# Patient Record
Sex: Female | Born: 1935 | Race: White | Hispanic: No | Smoking: Never smoker
Health system: Southern US, Community
[De-identification: ages and names within clinical notes are randomized; demographics above are authoritative.]

## PROBLEM LIST (undated history)

## (undated) DIAGNOSIS — I10 Essential (primary) hypertension: Secondary | ICD-10-CM

## (undated) DIAGNOSIS — C801 Malignant (primary) neoplasm, unspecified: Secondary | ICD-10-CM

## (undated) DIAGNOSIS — F419 Anxiety disorder, unspecified: Secondary | ICD-10-CM

---

## 2011-07-19 ENCOUNTER — Telehealth: Payer: Self-pay

## 2011-07-19 ENCOUNTER — Ambulatory Visit (INDEPENDENT_AMBULATORY_CARE_PROVIDER_SITE_OTHER): Payer: Medicare Other | Admitting: Family Medicine

## 2011-07-19 VITALS — BP 170/82 | HR 101 | Temp 98.0°F | Resp 16 | Ht 59.5 in | Wt 99.8 lb

## 2011-07-19 DIAGNOSIS — R300 Dysuria: Secondary | ICD-10-CM

## 2011-07-19 DIAGNOSIS — N390 Urinary tract infection, site not specified: Secondary | ICD-10-CM

## 2011-07-19 DIAGNOSIS — I10 Essential (primary) hypertension: Secondary | ICD-10-CM

## 2011-07-19 LAB — POCT UA - MICROSCOPIC ONLY
Casts, Ur, LPF, POC: NEGATIVE
Yeast, UA: NEGATIVE

## 2011-07-19 LAB — POCT URINALYSIS DIPSTICK
Glucose, UA: NEGATIVE
Nitrite, UA: NEGATIVE
Urobilinogen, UA: 0.2

## 2011-07-19 MED ORDER — CIPROFLOXACIN HCL 250 MG PO TABS: 250.0000 mg | ORAL_TABLET | Freq: Two times a day (BID) | ORAL | Status: AC

## 2011-07-19 NOTE — Progress Notes (Signed)
  Subjective:    Patient ID: Julie [name], female    DOB: [birth year], 78 y.o.   MRN: [id number]  Urinary Tract Infection  This is a new problem. The current episode started yesterday. The problem occurs every urination. The problem has been unchanged. The pain is at a severity of 3/10. The pain is mild. There has been no fever. She is not sexually active. There is no history of pyelonephritis. Associated symptoms include hesitancy and urgency. Pertinent negatives include no chills, flank pain or hematuria. She has tried nothing for the symptoms.      Review of Systems  Constitutional: Negative for chills.  Genitourinary: Positive for hesitancy and urgency. Negative for hematuria and flank pain.       Objective:   Physical Exam  Constitutional: She appears well-developed and well-nourished.  Cardiovascular: Normal rate, regular rhythm and normal heart sounds.   Pulmonary/Chest: Effort normal and breath sounds normal.  Abdominal: Soft. There is Tenderness: suprapubic.Marland Kitchen  Neurological: She is alert.  Skin: Skin is warm.     Results for orders placed in visit on 07/19/11  POCT UA - MICROSCOPIC ONLY      Component Value Range   WBC, Ur, HPF, POC TNTC     RBC, urine, microscopic 6-10     Bacteria, U Microscopic 1+     Mucus, UA NEG     Epithelial cells, urine per micros 1-3     Crystals, Ur, HPF, POC NEG     Casts, Ur, LPF, POC NEG     Yeast, UA NEG    POCT URINALYSIS DIPSTICK      Component Value Range   Color, UA YELLOW     Clarity, UA SL CLOUDY     Glucose, UA NEG     Bilirubin, UA NEG     Ketones, UA NEG     Spec Grav, UA 1.010     Blood, UA MOD     pH, UA 6.0     Protein, UA 30     Urobilinogen, UA 0.2     Nitrite, UA NEG     Leukocytes, UA large (3+)    URINE CULTURE      Component Value Range   Preliminary Report Culture reincubated for better growth          Assessment & Plan:   1. Cystitis  POCT UA - Microscopic Only, POCT urinalysis dipstick,  ciprofloxacin (CIPRO) 250 MG tablet, Urine culture  2. HTN (hypertension), white coat component Continue current medications

## 2011-07-19 NOTE — Telephone Encounter (Signed)
Pharmacy is calling stating that the patient was seen by dr Hal Hope today and that she should have medications called in but they are not there   [phone] pharmacy

## 2011-07-20 NOTE — Telephone Encounter (Signed)
Spoke with pharmacist, had spoke with Dr Hal Hope last night and had medicine cleared up, had no furthur questions.

## 2011-07-22 LAB — URINE CULTURE: Colony Count: 70000

## 2011-07-30 ENCOUNTER — Telehealth: Payer: Self-pay

## 2011-07-30 NOTE — Telephone Encounter (Signed)
.  [facility] PT STATES SHE HAVE ANOTHER YEAST INFECTION AND WOULD LIKE TO HAVE SOME CIPRO CALLED IN. PLEASE CALL PT AT [phone]    [facility] ON [address]

## 2011-07-31 NOTE — Telephone Encounter (Signed)
PT CALLING BACK - ALMOST 24 HOURS HAS PASSED AND HAS NOT BEEN ACKNOWLEDGED  PLEASE CALL WITH ANSWER REGARDING SOME INFORMATION

## 2011-08-01 ENCOUNTER — Encounter: Payer: Self-pay | Admitting: Radiology

## 2011-08-01 NOTE — Telephone Encounter (Signed)
Left message on machine for patient to call back.  Patient was seen 3/7 for UTI and given Cipro by Dr Hal Hope.  We need to find out patient's symptoms and then route call to PA pool.

## 2011-08-01 NOTE — Telephone Encounter (Signed)
Pt is calling back for the third time for an answer for her cipro.  [facility] at [address] Please call [phone]

## 2011-08-01 NOTE — Telephone Encounter (Signed)
Pt CB and stated that she does not have a yeast infection. She had a UTI and Dr Hal Hope put her on 5 days of Cipro, which seemed to clear up infection, but she only had 2 -3 good days and then Sxs started to come back. Not as bad as it was but feels a "heaviness" and some pain with urination. She stated that when she gets UTI, she usually takes a longer round of Cipro, and she thinks maybe the 5 days just wasn't long enough to clear it up completely. Can we send in another round to the [facility] ([place])?

## 2011-08-02 MED ORDER — CIPROFLOXACIN HCL 500 MG PO TABS: 500.0000 mg | ORAL_TABLET | Freq: Two times a day (BID) | ORAL | Status: AC

## 2011-08-02 NOTE — Telephone Encounter (Signed)
I sent in new rx cipro. If no improvement RTC.  This is only a 5 day RX.

## 2011-08-02 NOTE — Telephone Encounter (Signed)
Called pt back and told her Rx has been sent into her phamacy

## 2011-08-02 NOTE — Telephone Encounter (Signed)
Pt called back in and wondered status of new Abx. Apologized to pt that message did not route correctly to provider and that I would personally ask a provider to address this and call her back. Please review message.

## 2012-02-26 ENCOUNTER — Other Ambulatory Visit: Payer: Self-pay | Admitting: Family Medicine

## 2017-02-05 ENCOUNTER — Ambulatory Visit
Admission: RE | Admit: 2017-02-05 | Discharge: 2017-02-05 | Disposition: A | Discharge status: Home / Self Care | Payer: Self-pay | Source: Ambulatory Visit | Attending: Family Medicine | Admitting: Family Medicine

## 2017-02-05 ENCOUNTER — Other Ambulatory Visit: Payer: Self-pay | Admitting: Family Medicine

## 2017-02-05 DIAGNOSIS — E871 Hypo-osmolality and hyponatremia: Secondary | ICD-10-CM

## 2017-02-05 DIAGNOSIS — K5900 Constipation, unspecified: Secondary | ICD-10-CM

## 2018-01-22 ENCOUNTER — Other Ambulatory Visit: Payer: Self-pay

## 2018-01-22 ENCOUNTER — Emergency Department (HOSPITAL_COMMUNITY): Payer: Medicare Other

## 2018-01-22 ENCOUNTER — Inpatient Hospital Stay (HOSPITAL_COMMUNITY)
Admission: EM | Admit: 2018-01-22 | Discharge: 2018-01-28 | DRG: 643 | Disposition: A | Discharge status: Skilled Nursing Facility | Payer: Medicare Other | Attending: Internal Medicine | Admitting: Internal Medicine

## 2018-01-22 ENCOUNTER — Encounter (HOSPITAL_COMMUNITY): Payer: Self-pay | Admitting: Emergency Medicine

## 2018-01-22 DIAGNOSIS — I10 Essential (primary) hypertension: Secondary | ICD-10-CM | POA: Diagnosis present

## 2018-01-22 DIAGNOSIS — Z79899 Other long term (current) drug therapy: Secondary | ICD-10-CM

## 2018-01-22 DIAGNOSIS — Z7189 Other specified counseling: Secondary | ICD-10-CM | POA: Diagnosis not present

## 2018-01-22 DIAGNOSIS — F05 Delirium due to known physiological condition: Secondary | ICD-10-CM | POA: Diagnosis present

## 2018-01-22 DIAGNOSIS — Z881 Allergy status to other antibiotic agents status: Secondary | ICD-10-CM

## 2018-01-22 DIAGNOSIS — G9349 Other encephalopathy: Secondary | ICD-10-CM | POA: Diagnosis present

## 2018-01-22 DIAGNOSIS — Z681 Body mass index (BMI) 19 or less, adult: Secondary | ICD-10-CM | POA: Diagnosis not present

## 2018-01-22 DIAGNOSIS — R918 Other nonspecific abnormal finding of lung field: Secondary | ICD-10-CM | POA: Diagnosis present

## 2018-01-22 DIAGNOSIS — D6489 Other specified anemias: Secondary | ICD-10-CM | POA: Diagnosis present

## 2018-01-22 DIAGNOSIS — E876 Hypokalemia: Secondary | ICD-10-CM | POA: Diagnosis present

## 2018-01-22 DIAGNOSIS — Z23 Encounter for immunization: Secondary | ICD-10-CM

## 2018-01-22 DIAGNOSIS — E871 Hypo-osmolality and hyponatremia: Secondary | ICD-10-CM | POA: Diagnosis present

## 2018-01-22 DIAGNOSIS — I4891 Unspecified atrial fibrillation: Secondary | ICD-10-CM | POA: Diagnosis present

## 2018-01-22 DIAGNOSIS — I5030 Unspecified diastolic (congestive) heart failure: Secondary | ICD-10-CM | POA: Diagnosis not present

## 2018-01-22 DIAGNOSIS — E43 Unspecified severe protein-calorie malnutrition: Secondary | ICD-10-CM | POA: Diagnosis present

## 2018-01-22 DIAGNOSIS — R339 Retention of urine, unspecified: Secondary | ICD-10-CM | POA: Diagnosis present

## 2018-01-22 DIAGNOSIS — F411 Generalized anxiety disorder: Secondary | ICD-10-CM | POA: Diagnosis present

## 2018-01-22 DIAGNOSIS — T502X5A Adverse effect of carbonic-anhydrase inhibitors, benzothiadiazides and other diuretics, initial encounter: Secondary | ICD-10-CM | POA: Diagnosis present

## 2018-01-22 DIAGNOSIS — F419 Anxiety disorder, unspecified: Secondary | ICD-10-CM | POA: Diagnosis present

## 2018-01-22 DIAGNOSIS — N6320 Unspecified lump in the left breast, unspecified quadrant: Secondary | ICD-10-CM | POA: Diagnosis present

## 2018-01-22 DIAGNOSIS — R531 Weakness: Secondary | ICD-10-CM | POA: Diagnosis not present

## 2018-01-22 DIAGNOSIS — E222 Syndrome of inappropriate secretion of antidiuretic hormone: Secondary | ICD-10-CM | POA: Diagnosis present

## 2018-01-22 DIAGNOSIS — M79605 Pain in left leg: Secondary | ICD-10-CM | POA: Diagnosis present

## 2018-01-22 DIAGNOSIS — M79609 Pain in unspecified limb: Secondary | ICD-10-CM | POA: Diagnosis not present

## 2018-01-22 DIAGNOSIS — R4189 Other symptoms and signs involving cognitive functions and awareness: Secondary | ICD-10-CM | POA: Diagnosis not present

## 2018-01-22 DIAGNOSIS — G9340 Encephalopathy, unspecified: Secondary | ICD-10-CM

## 2018-01-22 DIAGNOSIS — Z66 Do not resuscitate: Secondary | ICD-10-CM | POA: Diagnosis present

## 2018-01-22 DIAGNOSIS — R630 Anorexia: Secondary | ICD-10-CM | POA: Diagnosis present

## 2018-01-22 DIAGNOSIS — R634 Abnormal weight loss: Secondary | ICD-10-CM | POA: Diagnosis present

## 2018-01-22 HISTORY — DX: Anxiety disorder, unspecified: F41.9

## 2018-01-22 HISTORY — DX: Essential (primary) hypertension: I10

## 2018-01-22 HISTORY — DX: Malignant (primary) neoplasm, unspecified: C80.1

## 2018-01-22 LAB — BASIC METABOLIC PANEL
Anion gap: 14 (ref 5–15)
Anion gap: 11 (ref 5–15)
BUN: 10 mg/dL (ref 8–23)
BUN: 9 mg/dL (ref 8–23)
CHLORIDE: 74 mmol/L — AB (ref 98–111)
CO2: 30 mmol/L (ref 22–32)
CO2: 26 mmol/L (ref 22–32)
CREATININE: 0.55 mg/dL (ref 0.44–1.00)
Calcium: 8.9 mg/dL (ref 8.9–10.3)
Calcium: 7.8 mg/dL — ABNORMAL LOW (ref 8.9–10.3)
Chloride: 65 mmol/L — ABNORMAL LOW (ref 98–111)
Creatinine, Ser: 0.52 mg/dL (ref 0.44–1.00)
GFR calc Af Amer: >60 mL/min (ref >60)
GFR calc Af Amer: >60 mL/min (ref >60)
GFR calc non Af Amer: >60 mL/min (ref >60)
GFR calc non Af Amer: >60 mL/min (ref >60)
GLUCOSE: 92 mg/dL (ref 70–99)
Glucose, Bld: 102 mg/dL — ABNORMAL HIGH (ref 70–99)
Potassium: 3.4 mmol/L — ABNORMAL LOW (ref 3.5–5.1)
Potassium: 2.7 mmol/L — CL (ref 3.5–5.1)
Sodium: 109 mmol/L — CL (ref 135–145)
Sodium: 111 mmol/L — CL (ref 135–145)

## 2018-01-22 LAB — COMPREHENSIVE METABOLIC PANEL
ALT: 35 U/L (ref 0–44)
AST: 54 U/L — AB (ref 15–41)
Albumin: 3.8 g/dL (ref 3.5–5.0)
Alkaline Phosphatase: 62 U/L (ref 38–126)
Anion gap: 14 (ref 5–15)
BUN: 12 mg/dL (ref 8–23)
CHLORIDE: 68 mmol/L — AB (ref 98–111)
CO2: 27 mmol/L (ref 22–32)
Calcium: 8.7 mg/dL — ABNORMAL LOW (ref 8.9–10.3)
Creatinine, Ser: 0.56 mg/dL (ref 0.44–1.00)
Glucose, Bld: 104 mg/dL — ABNORMAL HIGH (ref 70–99)
POTASSIUM: 3.1 mmol/L — AB (ref 3.5–5.1)
Sodium: 109 mmol/L — CL (ref 135–145)
Total Bilirubin: 1.3 mg/dL — ABNORMAL HIGH (ref 0.3–1.2)
Total Protein: 6.6 g/dL (ref 6.5–8.1)

## 2018-01-22 LAB — CBC WITH DIFFERENTIAL/PLATELET
ABS IMMATURE GRANULOCYTES: 0 10*3/uL (ref 0.0–0.1)
BASOS PCT: 0 %
Basophils Absolute: 0 10*3/uL (ref 0.0–0.1)
Eosinophils Absolute: 0 10*3/uL (ref 0.0–0.7)
Eosinophils Relative: 0 %
HCT: 33.3 % — ABNORMAL LOW (ref 36.0–46.0)
HEMOGLOBIN: 12.4 g/dL (ref 12.0–15.0)
Immature Granulocytes: 0 %
Lymphocytes Relative: 8 %
Lymphs Abs: 0.7 10*3/uL (ref 0.7–4.0)
MCH: 32.6 pg (ref 26.0–34.0)
MCHC: 37.2 g/dL — AB (ref 30.0–36.0)
MCV: 87.6 fL (ref 78.0–100.0)
Monocytes Absolute: 0.6 10*3/uL (ref 0.1–1.0)
Monocytes Relative: 7 %
NEUTROS ABS: 7.4 10*3/uL (ref 1.7–7.7)
NEUTROS PCT: 85 %
PLATELETS: 251 10*3/uL (ref 150–400)
RBC: 3.8 MIL/uL — AB (ref 3.87–5.11)
RDW: 11.7 % (ref 11.5–15.5)
WBC: 8.8 10*3/uL (ref 4.0–10.5)

## 2018-01-22 LAB — T4, FREE: Free T4: 1.66 ng/dL (ref 0.82–1.77)

## 2018-01-22 LAB — TROPONIN I
Troponin I: <0.03 ng/mL (ref <0.03)
Troponin I: <0.03 ng/mL (ref <0.03)

## 2018-01-22 LAB — URINALYSIS, ROUTINE W REFLEX MICROSCOPIC
Bilirubin Urine: NEGATIVE
Glucose, UA: NEGATIVE mg/dL
Hgb urine dipstick: NEGATIVE
Ketones, ur: 5 mg/dL — AB
Leukocytes, UA: NEGATIVE
Nitrite: NEGATIVE
Protein, ur: NEGATIVE mg/dL
Specific Gravity, Urine: 1.01 (ref 1.005–1.030)
pH: 8 (ref 5.0–8.0)

## 2018-01-22 LAB — PROTEIN / CREATININE RATIO, URINE
Creatinine, Urine: 29.77 mg/dL
Protein Creatinine Ratio: 0.44 mg/mg{Cre} — ABNORMAL HIGH (ref 0.00–0.15)
Total Protein, Urine: 13 mg/dL

## 2018-01-22 LAB — GLUCOSE, CAPILLARY: Glucose-Capillary: 100 mg/dL — ABNORMAL HIGH (ref 70–99)

## 2018-01-22 LAB — CBG MONITORING, ED: GLUCOSE-CAPILLARY: 104 mg/dL — AB (ref 70–99)

## 2018-01-22 LAB — NA AND K (SODIUM & POTASSIUM), RAND UR
Potassium Urine: 49 mmol/L
Sodium, Ur: 81 mmol/L

## 2018-01-22 LAB — MRSA PCR SCREENING: MRSA by PCR: NEGATIVE

## 2018-01-22 LAB — HEMOGLOBIN A1C
Hgb A1c MFr Bld: 5.1 % (ref 4.8–5.6)
Mean Plasma Glucose: 99.67 mg/dL

## 2018-01-22 LAB — OSMOLALITY, URINE: Osmolality, Ur: 401 mOsm/kg (ref 300–900)

## 2018-01-22 LAB — I-STAT TROPONIN, ED: TROPONIN I, POC: 0.03 ng/mL (ref 0.00–0.08)

## 2018-01-22 LAB — I-STAT CG4 LACTIC ACID, ED
LACTIC ACID, VENOUS: 1.47 mmol/L (ref 0.5–1.9)
LACTIC ACID, VENOUS: 1.86 mmol/L (ref 0.5–1.9)
LACTIC ACID, VENOUS: 1.03 mmol/L (ref 0.5–1.9)

## 2018-01-22 LAB — OSMOLALITY: Osmolality: 224 mOsm/kg — CL (ref 275–295)

## 2018-01-22 LAB — TSH: TSH: 0.7 u[IU]/mL (ref 0.350–4.500)

## 2018-01-22 LAB — MAGNESIUM: MAGNESIUM: 1.6 mg/dL — AB (ref 1.7–2.4)

## 2018-01-22 MED ORDER — SODIUM CHLORIDE 3 % IV SOLN: INTRAVENOUS | Status: DC

## 2018-01-22 MED ORDER — ACETAMINOPHEN 325 MG PO TABS
650.0000 mg | ORAL_TABLET | Freq: Four times a day (QID) | ORAL | Status: DC | PRN
  Administered 2018-01-24: 650 mg via ORAL
  Filled 2018-01-22: qty 2

## 2018-01-22 MED ORDER — ENOXAPARIN SODIUM 30 MG/0.3ML ~~LOC~~ SOLN: 30.0000 mg | SUBCUTANEOUS | Status: DC

## 2018-01-22 MED ORDER — POTASSIUM CHLORIDE 20 MEQ PO PACK: 40.0000 meq | PACK | Freq: Once | ORAL | Status: DC

## 2018-01-22 MED ORDER — SODIUM CHLORIDE 3 % IV SOLN
INTRAVENOUS | Status: DC
  Administered 2018-01-22 – 2018-01-23 (×2): 75 mL/h via INTRAVENOUS
  Filled 2018-01-22 (×4): qty 500

## 2018-01-22 MED ORDER — PNEUMOCOCCAL VAC POLYVALENT 25 MCG/0.5ML IJ INJ
0.5000 mL | INJECTION | INTRAMUSCULAR | Status: AC
  Administered 2018-01-23: 0.5 mL via INTRAMUSCULAR
  Filled 2018-01-22: qty 0.5

## 2018-01-22 MED ORDER — LACTATED RINGERS IV BOLUS: 1000.0000 mL | Freq: Once | INTRAVENOUS | Status: DC

## 2018-01-22 MED ORDER — CLONAZEPAM 0.5 MG PO TABS
0.2500 mg | ORAL_TABLET | Freq: Two times a day (BID) | ORAL | Status: DC | PRN
  Administered 2018-01-22: 0.25 mg via ORAL
  Filled 2018-01-22 (×2): qty 1

## 2018-01-22 MED ORDER — POTASSIUM CHLORIDE 10 MEQ/100ML IV SOLN
10.0000 meq | INTRAVENOUS | Status: AC
  Administered 2018-01-23 (×5): 10 meq via INTRAVENOUS
  Filled 2018-01-22 (×5): qty 100

## 2018-01-22 MED ORDER — POTASSIUM CHLORIDE CRYS ER 10 MEQ PO TBCR
10.0000 meq | EXTENDED_RELEASE_TABLET | Freq: Every day | ORAL | Status: DC
  Filled 2018-01-22: qty 1

## 2018-01-22 MED ORDER — SODIUM CHLORIDE 0.9 % IV SOLN
INTRAVENOUS | Status: DC
  Administered 2018-01-22: 18:00:00 via INTRAVENOUS

## 2018-01-22 MED ORDER — POTASSIUM CHLORIDE 10 MEQ/100ML IV SOLN
10.0000 meq | INTRAVENOUS | Status: DC
  Administered 2018-01-22: 10 meq via INTRAVENOUS
  Filled 2018-01-22: qty 100

## 2018-01-22 MED ORDER — POTASSIUM CHLORIDE 20 MEQ PO PACK
40.0000 meq | PACK | Freq: Once | ORAL | Status: DC
  Filled 2018-01-22: qty 2

## 2018-01-22 MED ORDER — MIRTAZAPINE 15 MG PO TABS
15.0000 mg | ORAL_TABLET | Freq: Every day | ORAL | Status: DC
  Filled 2018-01-22: qty 1

## 2018-01-22 MED ORDER — SODIUM CHLORIDE 0.9 % IV BOLUS
500.0000 mL | Freq: Once | INTRAVENOUS | Status: AC
  Administered 2018-01-22: 500 mL via INTRAVENOUS

## 2018-01-22 NOTE — Care Management Note (Signed)
Case Management Note  Patient Details  Name: Julie [name] MRN: [id number] Date of Birth: 11-18-1930  CM consulted for [facility] and DME.  CM spoke with pt's daughter Ginger via phone.  She chose [facility].  Discussed that the [facility] team would evaluate SNF rehab qualifications and if not would also then help with LTC ALF/SNF placement through transition of Medicaid to LTC Medicaid.  Ginger reports pt could use a walker and a 3-in-1.  There may be need for a wheelchair and a hospital bed but she would like the [facility] team to evaluate need.  Daughter had no further questions.  CM updated Dr. Ronnald Nian who placed needed orders.  CM contacted Georgina Snell with [facility] who accepted pt for serivces and is also aware of all of pt's needs.  Additionally spoke about possibly applying for [facility] along with previously discussed resources.  [facility] contact information placed on AVS.  CM contacted Jeneen Rinks with [facility] who will bring walker and 3-in-1 to pt prior to leaving the ED.  No further CM needs noted at this time.   Expected Discharge Date:    01/22/2018              Expected Discharge Plan:  [place]  Discharge planning Services  CM Consult  Post Acute Care Choice:  Durable Medical Equipment, Home Health Choice offered to:  Adult Children  DME Arranged:  3-N-1, Walker rolling DME Agency:  [place] Arranged:  RN, PT, OT, Nurse's Aide, Speech Therapy, Social Work [employer] Agency:  [facility]  Status of Service:  Completed, signed off  Tony Friscia, Benjaman Lobe, RN 01/22/2018, 2:00 PM

## 2018-01-22 NOTE — ED Notes (Signed)
REPAGED ADMITTING PER RN Seth Bake

## 2018-01-22 NOTE — ED Notes (Signed)
ED Provider at bedside. 

## 2018-01-22 NOTE — Consult Note (Addendum)
Julie [name]  SJG:[id number] DOB: Feb 11, 1931 DOA: 01/22/2018 PCP: Hayden Rasmussen, MD    LOS: 0 days   Reason for Consult / Chief Complaint:  Hyponatremia  Consulting MD and date of consult:  PCCM  HPI/summary of hospital stay:  84 year old female with PMHx of HTN on HCTZ who presents to [facility] with AMS.  Patient was found to have a Na of 108.  Head CT showed no significant changes, no acute disease.  The patient at 3pm was lethargic but able to follow commands and answer questions.  Reports not recent changes in her medications relevant to the hyponatremia but does report drinking a lot of water for feeling thirsty.  She continued to take her hydrochlorothiazide.  She is not up to date on her cancer screening and is not interested in knowing about it either or undergoing the testing.  Subjective  Called at 9:05 PM to see patient due to altered mental status, concern for protection of airway and possible seizure activity. Most recent sodium is 109. Daughter, grandson at bedside. They disclosed patient has a living will.  Upon my evaluation pt is somnolent, but awake, being suctioned by nursing Sat 99%  SBP 151 mmHg receiving a 500cc IVF bolus of Normal saline.   Assessment & Plan:  84 year old female on HCTZ, and possible polydipsia  at home presenting with Na of 108. Seen earlier on consult and Admitted to SDU. Over a period of six hours Na+ only increased to 109. Was receiving correction with 0.9%NS. Pt does not appear to have had seizure activity but her mental status has declined. There was concern regarding ability to protect airway and PCCM was re-consulted    Assessment and Plan: NEURO: Altered mental status- Acute toxic encephalopathy Symptomatic hyponatremia Started on Hypertonic saline with BMET Q 4 Neurochecks Seizure precautions  CARDIO: Currently euvolemic Not hypotensive on evaluation On chlorthalidone as an outpatient- continue to hold until sodium levels  improve Afib- new onset  PULM High Aspiration risk HOB >30 deg NPO Frequent suctioning Maintaining O2 sat >95% on room air If O2 sat <92% start supp O2  GI:  NPO When mental status improves  Assess swallow  ENDOCRINE: TSH 0.7 Hgb A1c 5.1  SKIN: Left breast mass ? Malignancy Goals of care and Palliative conversation needed prior to discussing aggressive interventions.    RENAL: Euvolemic hyponatremia, acute on chronic. Discussed with Pharmacy goal correction is 8 mEq in 24 hr period with 3% saline via peripheral not to exceed 75cc/hr.  BMET Q 4hr. will adjust rates based on Na+ levels.  If pt reaches Na 117 before 24 hr mark hold 3% saline until next BMP check Continue with PO Fluid restriction f/u UOP may require a foley catheter Reviewed home meds - On an ACE inhibitor  Hypokalemia  Goals of care: Discussed in detail with daughter and patient DNR  pt has a living will reviewed by me in the presence of family     Best Practice / Goals of Care / Disposition.   DNR-  Disposition / Summary of Today's Plan 01/22/18   Transfer to ICU  Consultants: date of consult/date signed off (if applicable)/final recs   Cosnult nephrology  Procedures: None  Significant Diagnostic Tests: None  Micro Data: N/A  Antimicrobials:  N/A    Objective    Examination: General: Chronically ill appearing female, decreased responsiveness HENT: [place]/AT, PERRL, EOM-I and MMM Lungs: CTA bilaterally Cardiovascular: RRR, Nl S1/S2 and -M/R/G Abdomen:  Soft, NT, ND and +BS Extremities: -edema and -tenderness Neuro: Lethargic,but moving all ext to command Skin: Intact, thin, erythema and areas of scaly rash over left breast.  Blood pressure (!) 151/74, pulse 73, temperature 98.1 F (36.7 C), temperature source Oral, resp. rate 17, height 5\' 3"  (1.6 m), weight 36.3 kg, SpO2 99 %.        Intake/Output Summary (Last 24 hours) at 01/22/2018 2105 Last data filed at 01/22/2018  2103 Gross per 24 hour  Intake 500 ml  Output -  Net 500 ml   Filed Weights   01/22/18 1223  Weight: 36.3 kg     Labs    CBC: Recent Labs  Lab 01/22/18 1444  WBC 8.8  NEUTROABS 7.4  HGB 12.4  HCT 33.3*  MCV 87.6  PLT 409   Basic Metabolic Panel: Recent Labs  Lab 01/22/18 1229 01/22/18 1854  NA 109* 109*  K 3.1* 3.4*  CL 68* 65*  CO2 27 30  GLUCOSE 104* 102*  BUN 12 10  CREATININE 0.56 0.52  CALCIUM 8.7* 8.9  MG 1.6*  --    GFR: Estimated Creatinine Clearance: 29.5 mL/min (by C-G formula based on SCr of 0.52 mg/dL). Recent Labs  Lab 01/22/18 1349 01/22/18 1408 01/22/18 1444 01/22/18 1916  WBC  --   --  8.8  --   LATICACIDVEN 1.47 1.86  --  1.03   Liver Function Tests: Recent Labs  Lab 01/22/18 1229  AST 54*  ALT 35  ALKPHOS 62  BILITOT 1.3*  PROT 6.6  ALBUMIN 3.8   No results for input(s): LIPASE, AMYLASE in the last 168 hours. No results for input(s): AMMONIA in the last 168 hours. ABG No results found for: PHART, PCO2ART, PO2ART, HCO3, TCO2, ACIDBASEDEF, O2SAT  Coagulation Profile: No results for input(s): INR, PROTIME in the last 168 hours. Cardiac Enzymes: Recent Labs  Lab 01/22/18 1854  TROPONINI <0.03   HbA1C: No results found for: HGBA1C CBG: Recent Labs  Lab 01/22/18 2027  GLUCAP 104*     Review of Systems:    Marland KitchenMarland KitchenReview of Systems  Unable to perform ROS: Mental status change  acute encephalopathy  Past medical history  She,  has a past medical history of Anxiety, Cancer ([place]), and Hypertension.   Surgical History      Social History   Social History   Socioeconomic History  . Marital status: Divorced    Spouse name: Not on file  . Number of children: Not on file  . Years of education: Not on file  . Highest education level: Not on file  Occupational History  . Not on file  Social Needs  . Financial resource strain: Not on file  . Food insecurity:    Worry: Not on file    Inability: Not on file  .  Transportation needs:    Medical: Not on file    Non-medical: Not on file  Tobacco Use  . Smoking status: Never Smoker  . Smokeless tobacco: Never Used  Substance and Sexual Activity  . Alcohol use: Never    Frequency: Never  . Drug use: Never  . Sexual activity: Not Currently  Lifestyle  . Physical activity:    Days per week: Not on file    Minutes per session: Not on file  . Stress: Not on file  Relationships  . Social connections:    Talks on phone: Not on file    Gets together: Not on file    Attends religious service:  Not on file    Active member of club or organization: Not on file    Attends meetings of clubs or organizations: Not on file    Relationship status: Not on file  . Intimate partner violence:    Fear of current or ex partner: Not on file    Emotionally abused: Not on file    Physically abused: Not on file    Forced sexual activity: Not on file  Other Topics Concern  . Not on file  Social History Narrative  . Not on file  ,  reports that she has never smoked. She has never used smokeless tobacco. She reports that she does not drink alcohol or use drugs.   Family history   Her family history is negative for Breast cancer.   Allergies Allergies  Allergen Reactions  . Nitrofurantoin Monohyd Macro Nausea Only    Home meds  Prior to Admission medications   Medication Sig Start Date End Date Taking? Authorizing Provider  chlorthalidone (HYGROTON) 25 MG tablet Take 25 mg by mouth daily. Extended release 12/31/17  Yes [provider]  clonazePAM (KLONOPIN) 0.5 MG tablet Take 0.0125 mg by mouth as needed for anxiety.  12/18/17  Yes [provider]  KLOR-CON M20 20 MEQ tablet Take 10 mEq by mouth daily. 11/09/17  Yes [provider]  losartan (COZAAR) 25 MG tablet Take 25 mg by mouth daily. 01/10/18  Yes [provider]  mirtazapine (REMERON) 30 MG tablet Take 15 mg by mouth at bedtime. 12/31/17  Yes [provider]   lisinopril (PRINIVIL,ZESTRIL) 10 MG tablet Take 1 tablet (10 mg total) by mouth daily. Needs office visit/labs Patient not taking: Reported on 01/22/2018 02/26/12   Collene Leyden, PA-C   Signed Dr Seward [facility] Critical Care Locums

## 2018-01-22 NOTE — ED Notes (Signed)
Critical care at bedside  

## 2018-01-22 NOTE — H&P (Addendum)
History and Physical    Julie [name] FKC:[id number] DOB: 1930-11-04 DOA: 01/22/2018  PCP: Hayden Rasmussen, MD Consultants: PCCM Patient coming from: Home- lives alone  Chief Complaint: Generalized weakness  HPI: Julie [name] is a 84 y.o. female with medical history significant for hypertension, anxiety who presented to the ED today complaining of generalized weakness.  Her daughter is here with her and states that her mom has had a slow decline both cognitively and physically over the last year.  She states that the decline has worsened over the last month.  Patient was started on hydrochlorothiazide about 1 year ago as well as mirtazapine around the same time.  The patient herself does not voice many complaints other than feeling anxious and nauseated because she is in the emergency department.  She states that she has been getting along fine in her apartment by herself.  Her daughter however is more concerned that she has been becoming more weak and debilitated and seems to have more problems with memory and attention.  The patient was taken off of mirtazapine at the end of last week because of her cognitive slowing and was instead started on Celexa.  After 3 days of being on the Celexa and off the mirtazapine her anxiety was severe enough that the mirtazapine was restarted and the Celexa was stopped.  At one point she was on 45 mg of mirtazapine.  She took 15 mg last night.  Her daughter states that patient has unintentionally overdosed on her medications more than once in the past couple of weeks.  Patient states that she does get thirsty and that she does have access to water and other fluids at home.  She is a vegetarian but otherwise has an unrestricted diet.  The daughter states that patient has had very little appetite especially over the last month.  She has lost a small amount of weight over the past year, unquantifiable.  The reason she was started on mirtazapine this time last year was  because she had had more extreme weight loss prior to that point.  Patient has a history of a breast mass on the left but historically has not wanted this worked up because she does not want to know what it might be.  However she tells me today that she is amenable to investigating it.  ED Course: Vital signs were stable.  She was afebrile.  Her sodium was noted to be 109.  Chest x-ray showed right lung base opacity.  Head CT showed no acute disease.  EKG showed atrial fibrillation and there are no priors for comparison.  She has no history of atrial fib.  Review of Systems: As per HPI; otherwise review of systems reviewed and negative.  No fevers, chills.  She had a cough last week but is now resolved.  No vomiting, diarrhea.  No trouble with urination or change in urinary habits.  Mild lower extremity swelling that is chronic.  No orthopnea, palpitations, chest pain, shortness of breath.  Ambulatory Status:  Ambulates without assistance  Past Medical History:  Diagnosis Date  . Cancer ([place])   . Hypertension     History reviewed. No pertinent surgical history.  Social History   Socioeconomic History  . Marital status: Divorced    Spouse name: Not on file  . Number of children: Not on file  . Years of education: Not on file  . Highest education level: Not on file  Occupational History  . Not on file  Social  Needs  . Financial resource strain: Not on file  . Food insecurity:    Worry: Not on file    Inability: Not on file  . Transportation needs:    Medical: Not on file    Non-medical: Not on file  Tobacco Use  . Smoking status: Never Smoker  Substance and Sexual Activity  . Alcohol use: Not on file  . Drug use: Not on file  . Sexual activity: Not on file  Lifestyle  . Physical activity:    Days per week: Not on file    Minutes per session: Not on file  . Stress: Not on file  Relationships  . Social connections:    Talks on phone: Not on file    Gets together: Not on  file    Attends religious service: Not on file    Active member of club or organization: Not on file    Attends meetings of clubs or organizations: Not on file    Relationship status: Not on file  . Intimate partner violence:    Fear of current or ex partner: Not on file    Emotionally abused: Not on file    Physically abused: Not on file    Forced sexual activity: Not on file  Other Topics Concern  . Not on file  Social History Narrative  . Not on file    Allergies  Allergen Reactions  . Nitrofurantoin Monohyd Macro Nausea Only    History reviewed. No pertinent family history.  Prior to Admission medications   Medication Sig Start Date End Date Taking? Authorizing Provider  chlorthalidone (HYGROTON) 25 MG tablet Take 25 mg by mouth daily. Extended release 12/31/17  Yes [provider]  clonazePAM (KLONOPIN) 0.5 MG tablet Take 0.0125 mg by mouth as needed for anxiety.  12/18/17  Yes [provider]  KLOR-CON M20 20 MEQ tablet Take 10 mEq by mouth daily. 11/09/17  Yes [provider]  losartan (COZAAR) 25 MG tablet Take 25 mg by mouth daily. 01/10/18  Yes [provider]  mirtazapine (REMERON) 30 MG tablet Take 15 mg by mouth at bedtime. 12/31/17  Yes [provider]  lisinopril (PRINIVIL,ZESTRIL) 10 MG tablet Take 1 tablet (10 mg total) by mouth daily. Needs office visit/labs Patient not taking: Reported on 01/22/2018 02/26/12   Collene Leyden, [place]    Physical Exam: Vitals:   01/22/18 1430 01/22/18 1445 01/22/18 1516 01/22/18 1530  BP: (!) 141/74 139/76 (!) 141/88 (!) 149/83  Pulse: 82 83 79 80  Resp: (!) 22 16 14  (!) 22  Temp:      TempSrc:      SpO2: 97% 96% 96% 96%  Weight:      Height:         . General: Frail elderly female, moderately anxious, otherwise NAD . Eyes:  PERRL, EOMI, normal lids, iris . ENT:  grossly normal hearing, lips & tongue, mmm; poor dentition, halitosis . Neck:  no LAD, masses or thyromegaly; no  carotid bruits . Cardiovascular: Irregularly irregular rhythm, no murmur  . Respiratory:   CTA bilaterally with no wheezes/rales/rhonchi.  Normal respiratory effort. . Abdomen:  soft, NT, ND, NABS . Back:   grossly normal alignment, no CVAT . Breast: Approximately 2 to 3 cm firm mobile nontender nodule in the upper outer quadrant of the left breast.  There is a palpable lymph node in the left axilla . Skin: Erythematous scaly rash over left breast in the area where the nipple should  be . Musculoskeletal: She is cachectic, grossly normal tone BUE/BLE, good ROM, no bony abnormality or obvious joint deformity . Lower extremity: Trace pitting bilateral lower extremity edema. Limited foot exam with no ulcerations.  2+ distal pulses. Marland Kitchen Psychiatric: She is anxious, speech is hesitant, she is oriented to hospital and city but not specific hospital.  She is not oriented to month date or year.  . Neurologic:  CN 2-12 grossly intact, moves all extremities in coordinated fashion, sensation intact        Radiological Exams on Admission: Dg Chest 2 View  Result Date: 01/22/2018 CLINICAL DATA:  Generalized weakness EXAM: CHEST - 2 VIEW COMPARISON:  February 05, 2017 FINDINGS: The heart size and mediastinal contours are stable. The heart size is enlarged. There is mild patchy opacity of right lung base. There is no pleural effusion or pulmonary edema. The lungs are hyperinflated. Kyphosis with compression deformities of multiple thoracic vertebra bodies are identified. IMPRESSION: Mild patchy opacity of right lung base, developing pneumonia is not excluded. Electronically Signed   By: Abelardo Diesel M.D.   On: 01/22/2018 13:59   Ct Head Wo Contrast  Result Date: 01/22/2018 CLINICAL DATA:  Confusion and altered mental status EXAM: CT HEAD WITHOUT CONTRAST TECHNIQUE: Contiguous axial images were obtained from the base of the skull through the vertex without intravenous contrast. COMPARISON:  None. FINDINGS:  Brain: There is relatively mild diffuse atrophy bilaterally. There is no intracranial mass, hemorrhage, extra-axial fluid collection, or midline shift. There is small vessel disease throughout the centra semiovale bilaterally. There is mild small vessel disease in each thalamus. No acute infarct is appreciable on this study. Vascular: No hyperdense vessels are evident. There is vascular calcification in each carotid siphon region. Skull: Bony calvarium appears intact. Sinuses/Orbits: There is a tiny osteoma in a posterior ethmoid air cell on the left. Paranasal sinuses otherwise are clear. Orbits appear symmetric bilaterally. Other: Mastoid air cells are clear. There is debris in the right external auditory canal. IMPRESSION: Atrophy with supratentorial small vessel disease. No acute infarct. No mass or hemorrhage. There are foci of arterial vascular calcification. There is probable cerumen in the right external auditory canal. Electronically Signed   By: Lowella Grip III M.D.   On: 01/22/2018 13:42    EKG: Independently reviewed.   Date/Time:                  Wednesday January 22 2018 12:27:32 EDT Ventricular Rate:         91 PR Interval:                   QRS Duration: 89 QT Interval:                 314 QTC Calculation:        387 R Axis:                         15 Text Interpretation:       Atrial fibrillation LVH with secondary repolarization abnormality Probable anterior infarct, age indeterminate    Labs on Admission: I have personally reviewed the available labs and imaging studies at the time of the admission.  Pertinent labs:  Sodium 109 Potassium 3.1 Chloride 68 CO2 27  glucose 104  BUN 12  creatinine 0.56 Calcium 8.7 Magnesium 1.6 AST 54 ALT 35 Total protein 6.6 Total bilirubin 1.3 Troponin 0 0.03 Lactic acid 1.47 trended to 1.86 White blood cells 8.8  Hemoglobin 12.4 Plt 251   Assessment/Plan Principal Problem:   Hyponatremia Active Problems:   Anxiety    Mass of breast, left   Cognitive decline   Opacity of lung on imaging study   Anorexia   Weight loss   Hypokalemia   Hyponatremia, euvolemic: Other than mild weakness and some cognitive deficits, patient is asymptomatic which indicates this is a chronic problem.  We do not have prior records with labs available at this time.  Her daughter is attempting to get these records from her primary care doctor.  Likely etiology of her hyponatremia is hydrochlorothiazide and potentially SIADH, which if present would likely be due to medication and/or undiagnosed malignancy.  -Slow correction of sodium at 75 cc of normal saline per hour -BMP every 2 hours to establish rate of correction, can then spread out to every 4 or every 8 hours. -Hold hydrochlorothiazide.  Would not restart. -Check urine osm, urine sodium -check TSH -Monitor for airway protection  Left breast mass: Concerning for malignancy and with overlying changes of the skin of the breast, left axillary palpable lymph node.  Historically patient has not wanted to have this investigated further but currently she is stating she would like this to be worked up.  Her daughter has asked that if we determine she has cancer to discuss it with the family before discussing it with the patient due to patient's extreme anxiety. -Left breast ultrasound, will likely need FNA.  Patient states that she wishes to have this done.  Opacity in right lung base: She has no symptoms or signs of pneumonia.  She has no fever, dyspnea, cough, leukocytosis.  She has never smoked.  Concern for potential metastasis. Consider chest CT; pt and family will need to be engaged in ongoing discussion regarding her wishes for thorough / invasive workup.   Anxiety:  -cont remeron but lower dose 15 mg nightly -clonazepam home dose 0.25 mg BID prn  HTN -Currently controlled -Continue to hold hydrochlorothiazide, would not restart -If needed for blood pressure control could give  losartan  Atrial Fibrillation -Patient presenting with new-onset afib.  -Etiology is unknown but could be related to hyponatremia. -Troponin x 3 -TTE ordered for further evaluation  -Will risk stratify with FLP and HgbA1c; will also order TSH/free T4, urine protein -Repeat EKG in AM and plan to consult cardiology at that time. -Heart rate is well controlled on no meds -CHA2DS2-VASc Score is 4 and so patient will need oral anticoagulation.  HAS-BLED score is 2.  Assuming no moderate to severe mitral stenosis on echocardiogram, would initiate anticoagulation with a NOAC. -Consider possibility that patient has developed congestive heart failure which could contribute to hyponatremia as well as atrial fib.  Follow-up echo.  Hypokalemia -replace with KCl 40 mEq, monitor BMP  Poor appetite, weight loss -Nutrition consult  Ethics: -pt reiterated to me her wish to be full code. She appeared to understand.  -PT/OT, social work consult re: disposition plans. Pt may not be suitable to live alone at this point.   DVT prophylaxis:  Lovenox Code Status:  Full - confirmed with patient/family Family Communication: daughter at bedside  Disposition Plan: TBD Consults called: PCCM  Admission status: Admit - It is my clinical opinion that admission to INPATIENT is reasonable and necessary because of the expectation that this patient will require hospital care that crosses at least 2 midnights to treat this condition based on the medical complexity of the problems presented.  Given the aforementioned information, the  predictability of an adverse outcome is felt to be significant.   Time spent: 80 minutes  Julie Norlander MD [facility]  If note is complete, please contact covering daytime or nighttime physician. [email or web address] Password [facility]  01/22/2018, 4:35 PM

## 2018-01-22 NOTE — ED Notes (Signed)
Paged admitting Per RN Seth Bake about BP

## 2018-01-22 NOTE — ED Notes (Signed)
MD Opyd at bedside. 

## 2018-01-22 NOTE — Significant Event (Signed)
Ms. Gladson is an 34 yof with hx of anxiety and HTN, presenting with 2 days of decreased LOC, found to have severe hyponatremia in setting of HCTZ use and apparent euvolemia. She had been conversant at time of admission per notes.   Was alerted by RN that SBP had dropped into 80's. We gave a 500 cc NS bolus and BP recovered quickly, prior to bolus completion. A few minutes later, patient was noted to be unresponsive, drooling.   Per family at bedside, patient has advanced directives indicating that she would want aggressive measures unless she is not expected to make a meaningful recovery.   There is no clinical seizure activity, but patient not responding and there is concern about her ability to protect airway. Discussed with PCCM and recommendations given for hypertonic saline; they will reevaluate her. Discussed plan with RN for hypertonic saline, PCCM eval.

## 2018-01-22 NOTE — Progress Notes (Signed)
CSW received call from MD informing CSW that pt is looking to get further resources for in the home. CSW spoke with POA Ginger and was informed that pt would not be interested in SNF placement as pt likes being independent.   CSW spoke with Ginger and was informed that they are interested in pt getting home health services. CSW has updated RNCM of this at this time and she is working on getting further needs met at this time. CSW will sign off as there are no further CSW needs warranted at this time.    Julie [name], MSW, [facility] Clinical Social Worker [phone]

## 2018-01-22 NOTE — ED Triage Notes (Signed)
Patient presents to the ED with complaints of generalized weakness. Per EMS patient family states patient has not been eating and drinking x4 days. Patient denies any complaints just states  weakness.  EMS states patient confused. Patient family requesting med consult with Psych .

## 2018-01-22 NOTE — Progress Notes (Signed)
CRITICAL VALUE ALERT  Critical Value:  Potassium 2.7; Sodium 111, Osmolality 227  Date & Time Notied:  01/22/2018 2320  Provider Notified: Alva Garnet  Orders Received/Actions taken: 6 runs K+

## 2018-01-22 NOTE — Consult Note (Signed)
Julie [name]  PPI:[id number] DOB: 1930/10/29 DOA: 01/22/2018 PCP: Hayden Rasmussen, MD    LOS: 0 days   Reason for Consult / Chief Complaint:  Hyponatremia  Consulting MD and date of consult:  PCCM  HPI/summary of hospital stay:  84 year old female with PMH of HTN on HCTZ who presents to [facility] with AMS.  Patient was found to have a Na of 108.  Head CT that I reviewed myself that showed no significant changes, no acute disease.  The patient lethargic but able to follow commands and answer questions.  Reports not recent changes in her medications relevant to the hyponatremia but does report drinking a lot of water for feeling thirsty.  She continued to take her hydrochlorothiazide.  She is not up to date on her cancer screening and is not interested in knowing about it either or undergoing the testing.  She wishes to be DNR.  Subjective  Feels "fine" no new complaints.  Assessment & Plan:  84 year old female on HCTZ at home presenting with Na of 108 but is completely asymptomatic indicating that patient had this happen over a very long period.  Discussed with EDP and PCCM-NP.  Euvolemic hyponatremia, chronic.  - NS correction should be done very slowly at 0.5 meq/hr or less.  Correction rate via NS is 200 ml/hour, however given the patient's age I would not give that much.  Would give 75 ml/hr and frequent BMET checks to insure that the Na is increasing at a very slow rate.  - No need to look cancers at this point since patient does not wish for any interventions  - BMET q2 hours x4 and once rate of correction is established then decrease to q4 or 8.  - Hold further HCTZ, would not recommend using it as outpatient  AMS:  - Monitor for airway protection  - Correct Na  Goals of care:  - DNR per patient's wishes, will place order  HTN: would monitor for now, may use losartan if BP increases  Ok for TRH to admit to the SDU.  No need for ICU admission.  PCCM will sign off, please call  back if needed.  Best Practice / Goals of Care / Disposition.   DNR  Disposition / Summary of Today's Plan 01/22/18   Admit to SDU as above.  Consultants: date of consult/date signed off (if applicable)/final recs   Consider nephrology consult  Procedures: None  Significant Diagnostic Tests: None  Micro Data: N/A  Antimicrobials:  N/A    Objective    Examination: General: Chronically ill appearing female, NAD HENT: [place]/AT, PERRL, EOM-I and MMM Lungs: CTA bilaterally Cardiovascular: RRR, Nl S1/S2 and -M/R/G Abdomen: Soft, NT, ND and +BS Extremities: -edema and -tenderness Neuro: Lethargic, alert and interactive, moving all ext to command Skin: Intact, thin  Blood pressure (!) 149/83, pulse 80, temperature 98.1 F (36.7 C), temperature source Oral, resp. rate (!) 22, height 5\' 3"  (1.6 m), weight 36.3 kg, SpO2 96 %.       No intake or output data in the 24 hours ending 01/22/18 1533 Filed Weights   01/22/18 1223  Weight: 36.3 kg     Labs    CBC: No results for input(s): WBC, NEUTROABS, HGB, HCT, MCV, PLT in the last 168 hours. Basic Metabolic Panel: Recent Labs  Lab 01/22/18 1229  NA 109*  K 3.1*  CL 68*  CO2 27  GLUCOSE 104*  BUN 12  CREATININE 0.56  CALCIUM 8.7*  MG 1.6*   GFR: Estimated Creatinine Clearance: 29.5 mL/min (by C-G formula based on SCr of 0.56 mg/dL). Recent Labs  Lab 01/22/18 1349 01/22/18 1408  LATICACIDVEN 1.47 1.86   Liver Function Tests: Recent Labs  Lab 01/22/18 1229  AST 54*  ALT 35  ALKPHOS 62  BILITOT 1.3*  PROT 6.6  ALBUMIN 3.8   No results for input(s): LIPASE, AMYLASE in the last 168 hours. No results for input(s): AMMONIA in the last 168 hours. ABG No results found for: PHART, PCO2ART, PO2ART, HCO3, TCO2, ACIDBASEDEF, O2SAT  Coagulation Profile: No results for input(s): INR, PROTIME in the last 168 hours. Cardiac Enzymes: No results for input(s): CKTOTAL, CKMB, CKMBINDEX, TROPONINI in the last 168  hours. HbA1C: No results found for: HGBA1C CBG: No results for input(s): GLUCAP in the last 168 hours.   Review of Systems:     Past medical history  She,  has a past medical history of Cancer ([place]) and Hypertension.   Surgical History      Social History   Social History   Socioeconomic History  . Marital status: Divorced    Spouse name: Not on file  . Number of children: Not on file  . Years of education: Not on file  . Highest education level: Not on file  Occupational History  . Not on file  Social Needs  . Financial resource strain: Not on file  . Food insecurity:    Worry: Not on file    Inability: Not on file  . Transportation needs:    Medical: Not on file    Non-medical: Not on file  Tobacco Use  . Smoking status: Never Smoker  Substance and Sexual Activity  . Alcohol use: Not on file  . Drug use: Not on file  . Sexual activity: Not on file  Lifestyle  . Physical activity:    Days per week: Not on file    Minutes per session: Not on file  . Stress: Not on file  Relationships  . Social connections:    Talks on phone: Not on file    Gets together: Not on file    Attends religious service: Not on file    Active member of club or organization: Not on file    Attends meetings of clubs or organizations: Not on file    Relationship status: Not on file  . Intimate partner violence:    Fear of current or ex partner: Not on file    Emotionally abused: Not on file    Physically abused: Not on file    Forced sexual activity: Not on file  Other Topics Concern  . Not on file  Social History Narrative  . Not on file  ,  reports that she has never smoked. She does not have any smokeless tobacco history on file.   Family history   Her family history is not on file.   Allergies Allergies  Allergen Reactions  . Nitrofurantoin Monohyd Macro Nausea Only    Home meds  Prior to Admission medications   Medication Sig Start Date End Date Taking? Authorizing  Provider  chlorthalidone (HYGROTON) 25 MG tablet Take 25 mg by mouth daily. Extended release 12/31/17  Yes [provider]  clonazePAM (KLONOPIN) 0.5 MG tablet Take 0.0125 mg by mouth as needed for anxiety.  12/18/17  Yes [provider]  KLOR-CON M20 20 MEQ tablet Take 10 mEq by mouth daily. 11/09/17  Yes [provider]  losartan (  COZAAR) 25 MG tablet Take 25 mg by mouth daily. 01/10/18  Yes [provider]  mirtazapine (REMERON) 30 MG tablet Take 15 mg by mouth at bedtime. 12/31/17  Yes [provider]  lisinopril (PRINIVIL,ZESTRIL) 10 MG tablet Take 1 tablet (10 mg total) by mouth daily. Needs office visit/labs Patient not taking: Reported on 01/22/2018 02/26/12   Collene Leyden, PA-C    Rush Farmer, M.D. [facility] Pulmonary/Critical Care Medicine. Pager: [phone]. After hours pager: [phone].

## 2018-01-22 NOTE — ED Provider Notes (Addendum)
[facility] EMERGENCY DEPARTMENT Provider Note   CSN: [id number] Arrival date & time: 01/22/18  1210     History   Chief Complaint Chief Complaint  Patient presents with  . Weakness    HPI Julie [name] is a 84 y.o. female.  Patient with history of hypertension who presents the ED with general weakness.  Patient is here with family who is concerned about her generally slow decline.  Patient was recently taken off of her mirtazapine and started on Celexa and then switch back several days ago.  She recently had a urinary tract infection and finished antibiotics.  She has had decreased appetite and patient has had some cognitive decline.  No history of trauma.  Patient has no complaints.  No pain.  The history is provided by the patient and a relative.  Illness  This is a new problem. The current episode started more than 2 days ago. The problem occurs daily. The problem has been gradually worsening. Pertinent negatives include no chest pain, no abdominal pain, no headaches and no shortness of breath. Nothing aggravates the symptoms. Nothing relieves the symptoms. She has tried nothing for the symptoms. The treatment provided no relief.    Past Medical History:  Diagnosis Date  . Cancer ([place])   . Hypertension     Patient Active Problem List   Diagnosis Date Noted  . HTN (hypertension) 07/19/2011     OB History   None      Home Medications    Prior to Admission medications   Medication Sig Start Date End Date Taking? Authorizing Provider  chlorthalidone (HYGROTON) 25 MG tablet Take 25 mg by mouth daily. Extended release 12/31/17  Yes [provider]  clonazePAM (KLONOPIN) 0.5 MG tablet Take 0.0125 mg by mouth as needed for anxiety.  12/18/17  Yes [provider]  KLOR-CON M20 20 MEQ tablet Take 10 mEq by mouth daily. 11/09/17  Yes [provider]  losartan (COZAAR) 25 MG tablet Take 25 mg by mouth daily. 01/10/18  Yes [provider]  mirtazapine (REMERON) 30 MG tablet Take 15 mg by mouth at bedtime. 12/31/17  Yes [provider]  lisinopril (PRINIVIL,ZESTRIL) 10 MG tablet Take 1 tablet (10 mg total) by mouth daily. Needs office visit/labs Patient not taking: Reported on 01/22/2018 02/26/12   Collene Leyden, PA-C    Family History No family history on file.  Social History Social History   Tobacco Use  . Smoking status: Never Smoker  Substance Use Topics  . Alcohol use: Not on file  . Drug use: Not on file     Allergies   Nitrofurantoin monohyd macro   Review of Systems Review of Systems  Constitutional: Positive for activity change, appetite change and fatigue. Negative for chills and fever.  HENT: Negative for ear pain and sore throat.   Eyes: Negative for pain and visual disturbance.  Respiratory: Negative for cough and shortness of breath.   Cardiovascular: Negative for chest pain and palpitations.  Gastrointestinal: Negative for abdominal pain and vomiting.  Genitourinary: Negative for dysuria and hematuria.  Musculoskeletal: Negative for arthralgias and back pain.  Skin: Negative for color change and rash.  Neurological: Negative for seizures, syncope and headaches.  All other systems reviewed and are negative.    Physical Exam Updated Vital Signs  ED Triage Vitals  Enc Vitals Group     BP --      Pulse --      Resp --  Temp --      Temp src --      SpO2 01/22/18 1219 95 %     Weight 01/22/18 1223 80 lb (36.3 kg)     Height 01/22/18 1223 5\' 3"  (1.6 m)     Head Circumference --      Peak Flow --      Pain Score 01/22/18 1222 2     Pain Loc --      Pain Edu? --      Excl. in [place]? --     Physical Exam  Constitutional: She is oriented to person, place, and time. She appears well-developed and well-nourished. No distress.  HENT:  Head: Normocephalic and atraumatic.  Mouth/Throat: No oropharyngeal exudate.  Eyes: Pupils are equal, round, and reactive to  light. Conjunctivae and EOM are normal.  Neck: Normal range of motion. Neck supple.  Cardiovascular: Normal rate, normal heart sounds and intact distal pulses. An irregularly irregular rhythm present.  No murmur heard. Pulmonary/Chest: Effort normal and breath sounds normal. No respiratory distress.  Abdominal: Soft. Bowel sounds are normal. She exhibits no distension. There is no tenderness.  Musculoskeletal: Normal range of motion. She exhibits no edema.  Neurological: She is alert and oriented to person, place, and time. No cranial nerve deficit or sensory deficit. She exhibits normal muscle tone. Coordination normal.  Skin: Skin is warm and dry.  Psychiatric: She has a normal mood and affect.  Nursing note and vitals reviewed.    ED Treatments / Results  Labs (all labs ordered are listed, but only abnormal results are displayed) Labs Reviewed  COMPREHENSIVE METABOLIC PANEL - Abnormal; Notable for the following components:      Result Value   Sodium 109 (*)    Potassium 3.1 (*)    Chloride 68 (*)    Glucose, Bld 104 (*)    Calcium 8.7 (*)    AST 54 (*)    Total Bilirubin 1.3 (*)    All other components within normal limits  MAGNESIUM - Abnormal; Notable for the following components:   Magnesium 1.6 (*)    All other components within normal limits  CBC WITH DIFFERENTIAL/PLATELET - Abnormal; Notable for the following components:   RBC 3.80 (*)    HCT 33.3 (*)    MCHC 37.2 (*)    All other components within normal limits  CBC WITH DIFFERENTIAL/PLATELET  URINALYSIS, ROUTINE W REFLEX MICROSCOPIC  NA AND K (SODIUM & POTASSIUM), RAND UR  OSMOLALITY, URINE  OSMOLALITY  I-STAT CG4 LACTIC ACID, ED  I-STAT TROPONIN, ED  I-STAT CG4 LACTIC ACID, ED    EKG EKG Interpretation  Date/Time:  Wednesday January 22 2018 12:27:32 EDT Ventricular Rate:  91 PR Interval:    QRS Duration: 89 QT Interval:  314 QTC Calculation: 387 R Axis:   15 Text Interpretation:  Atrial  fibrillation LVH with secondary repolarization abnormality Probable anterior infarct, age indeterminate Confirmed by Lennice Sites [phone]) on 01/22/2018 12:33:53 PM Also confirmed by Lennice Sites [phone]), editor Philomena Doheny [phone])  on 01/22/2018 12:54:02 PM   Radiology Dg Chest 2 View  Result Date: 01/22/2018 CLINICAL DATA:  Generalized weakness EXAM: CHEST - 2 VIEW COMPARISON:  February 05, 2017 FINDINGS: The heart size and mediastinal contours are stable. The heart size is enlarged. There is mild patchy opacity of right lung base. There is no pleural effusion or pulmonary edema. The lungs are hyperinflated. Kyphosis with compression deformities of multiple thoracic vertebra bodies are identified. IMPRESSION: Mild patchy opacity  of right lung base, developing pneumonia is not excluded. Electronically Signed   By: Abelardo Diesel M.D.   On: 01/22/2018 13:59   Ct Head Wo Contrast  Result Date: 01/22/2018 CLINICAL DATA:  Confusion and altered mental status EXAM: CT HEAD WITHOUT CONTRAST TECHNIQUE: Contiguous axial images were obtained from the base of the skull through the vertex without intravenous contrast. COMPARISON:  None. FINDINGS: Brain: There is relatively mild diffuse atrophy bilaterally. There is no intracranial mass, hemorrhage, extra-axial fluid collection, or midline shift. There is small vessel disease throughout the centra semiovale bilaterally. There is mild small vessel disease in each thalamus. No acute infarct is appreciable on this study. Vascular: No hyperdense vessels are evident. There is vascular calcification in each carotid siphon region. Skull: Bony calvarium appears intact. Sinuses/Orbits: There is a tiny osteoma in a posterior ethmoid air cell on the left. Paranasal sinuses otherwise are clear. Orbits appear symmetric bilaterally. Other: Mastoid air cells are clear. There is debris in the right external auditory canal. IMPRESSION: Atrophy with supratentorial small vessel  disease. No acute infarct. No mass or hemorrhage. There are foci of arterial vascular calcification. There is probable cerumen in the right external auditory canal. Electronically Signed   By: Lowella Grip III M.D.   On: 01/22/2018 13:42    Procedures .Critical Care Performed by: Lennice Sites, DO Authorized by: Lennice Sites, DO   Critical care provider statement:    Critical care time (minutes):  35   Critical care was necessary to treat or prevent imminent or life-threatening deterioration of the following conditions:  Metabolic crisis (Hyponatremia (109))   Critical care was time spent personally by me on the following activities:  Development of treatment plan with patient or surrogate, discussions with primary provider, discussions with consultants, obtaining history from patient or surrogate, ordering and performing treatments and interventions, ordering and review of laboratory studies, ordering and review of radiographic studies, pulse oximetry and re-evaluation of patient's condition   (including critical care time)  Medications Ordered in ED Medications - No data to display   Initial Impression / Assessment and Plan / ED Course  I have reviewed the triage vital signs and the nursing notes.  Pertinent labs & imaging results that were available during my care of the patient were reviewed by me and considered in my medical decision making (see chart for details).     Shalie Schremp is an 84 year old female with history of hypertension who presents to the ED with weakness.  Patient with normal vitals.  No fever.  Patient with general fatigue and weakness and mild mental status change over the last several days to weeks.  Patient had recent urinary tract infection.  Recent change in antidepressant medications with mirtazapine and Celexa.  Family concerned about her mental state.  Patient is not drinking well.  Patient overall is well-appearing.  She has no complaints.  No chest  pain, no shortness of breath.  Patient denies fever, cough, sputum production.  No abdominal pain.  Appears neurologically intact.  No obvious weakness.  Will obtain basic labs, urine studies, chest x-ray, head CT.  Patient has a history of hypokalemia according to her daughter.  Patient with sodium of 109, potassium 3.1, magnesium 1.6.  Otherwise no significant anemia, leukocytosis.  Lactic acid within normal limits.  Head CT with no acute findings.  Chest x-ray with possible opacity but no fever, no white count, no cough, no sputum production and doubt pneumonia.  Patient is on diuretic and possible  cause of low sodium.  She also has had poor nutrition as well.  She has a history of possible breast mass and concern for possible SIADH.  Urine electrolytes, serum osm are ordered.  ICU was consulted and came down to the ED to evaluate the patient.  They will make recommendations for rehydration and potassium and electrolyte repletion.  They recommend admission to general medicine team.  Patient remained hemodynamically stable throughout my care.  Mental status stable.  No signs of seizures.  Patient omitted to medicine service and good condition.  Urine studies pending at time of handoff.  This chart was dictated using voice recognition software.  Despite best efforts to proofread,  errors can occur which can change the documentation meaning.   Final Clinical Impressions(s) / ED Diagnoses   Final diagnoses:  Hyponatremia  Hypokalemia  Hypomagnesemia    ED Discharge Orders    None       Lennice Sites, DO 01/22/18 [place], [place], DO 01/22/18 1634

## 2018-01-22 NOTE — ED Notes (Signed)
Pt CBG 104

## 2018-01-22 NOTE — ED Notes (Signed)
Ginger (daughter) [phone]

## 2018-01-23 ENCOUNTER — Inpatient Hospital Stay (HOSPITAL_COMMUNITY): Payer: Medicare Other

## 2018-01-23 ENCOUNTER — Other Ambulatory Visit: Payer: Self-pay

## 2018-01-23 DIAGNOSIS — R634 Abnormal weight loss: Secondary | ICD-10-CM

## 2018-01-23 DIAGNOSIS — E43 Unspecified severe protein-calorie malnutrition: Secondary | ICD-10-CM | POA: Diagnosis present

## 2018-01-23 DIAGNOSIS — I5030 Unspecified diastolic (congestive) heart failure: Secondary | ICD-10-CM

## 2018-01-23 LAB — BASIC METABOLIC PANEL
Anion gap: 11 (ref 5–15)
Anion gap: 10 (ref 5–15)
Anion gap: 8 (ref 5–15)
Anion gap: 7 (ref 5–15)
BUN: 9 mg/dL (ref 8–23)
BUN: 8 mg/dL (ref 8–23)
BUN: 7 mg/dL — ABNORMAL LOW (ref 8–23)
BUN: 8 mg/dL (ref 8–23)
CALCIUM: 7.9 mg/dL — AB (ref 8.9–10.3)
CALCIUM: 7.9 mg/dL — AB (ref 8.9–10.3)
CHLORIDE: 73 mmol/L — AB (ref 98–111)
CHLORIDE: 85 mmol/L — AB (ref 98–111)
CHLORIDE: 93 mmol/L — AB (ref 98–111)
CO2: 28 mmol/L (ref 22–32)
CO2: 22 mmol/L (ref 22–32)
CO2: 24 mmol/L (ref 22–32)
CO2: 23 mmol/L (ref 22–32)
CREATININE: 0.5 mg/dL (ref 0.44–1.00)
CREATININE: 0.49 mg/dL (ref 0.44–1.00)
CREATININE: 0.5 mg/dL (ref 0.44–1.00)
Calcium: 8 mg/dL — ABNORMAL LOW (ref 8.9–10.3)
Calcium: 7.7 mg/dL — ABNORMAL LOW (ref 8.9–10.3)
Chloride: 91 mmol/L — ABNORMAL LOW (ref 98–111)
Creatinine, Ser: 0.58 mg/dL (ref 0.44–1.00)
GFR calc Af Amer: >60 mL/min (ref >60)
GFR calc Af Amer: >60 mL/min (ref >60)
GFR calc non Af Amer: >60 mL/min (ref >60)
GFR calc non Af Amer: >60 mL/min (ref >60)
GFR calc non Af Amer: >60 mL/min (ref >60)
Glucose, Bld: 79 mg/dL (ref 70–99)
Glucose, Bld: 82 mg/dL (ref 70–99)
Glucose, Bld: 90 mg/dL (ref 70–99)
Glucose, Bld: 118 mg/dL — ABNORMAL HIGH (ref 70–99)
Potassium: 2.5 mmol/L — CL (ref 3.5–5.1)
Potassium: 3.3 mmol/L — ABNORMAL LOW (ref 3.5–5.1)
Potassium: 3.2 mmol/L — ABNORMAL LOW (ref 3.5–5.1)
Potassium: 3 mmol/L — ABNORMAL LOW (ref 3.5–5.1)
Sodium: 112 mmol/L — CL (ref 135–145)
Sodium: 117 mmol/L — CL (ref 135–145)
Sodium: 123 mmol/L — ABNORMAL LOW (ref 135–145)
Sodium: 123 mmol/L — ABNORMAL LOW (ref 135–145)

## 2018-01-23 LAB — CBC
HCT: 31.8 % — ABNORMAL LOW (ref 36.0–46.0)
Hemoglobin: 11.5 g/dL — ABNORMAL LOW (ref 12.0–15.0)
MCH: 32.4 pg (ref 26.0–34.0)
MCHC: 36.2 g/dL — ABNORMAL HIGH (ref 30.0–36.0)
MCV: 89.6 fL (ref 78.0–100.0)
Platelets: 227 10*3/uL (ref 150–400)
RBC: 3.55 MIL/uL — ABNORMAL LOW (ref 3.87–5.11)
RDW: 11.9 % (ref 11.5–15.5)
WBC: 7.8 10*3/uL (ref 4.0–10.5)

## 2018-01-23 LAB — SODIUM, URINE, RANDOM: SODIUM UR: 65 mmol/L

## 2018-01-23 LAB — LIPID PANEL
Cholesterol: 155 mg/dL (ref 0–200)
HDL: 67 mg/dL (ref >40)
LDL Cholesterol: 80 mg/dL (ref 0–99)
Total CHOL/HDL Ratio: 2.3 RATIO
Triglycerides: 40 mg/dL (ref <150)
VLDL: 8 mg/dL (ref 0–40)

## 2018-01-23 LAB — OSMOLALITY, URINE: OSMOLALITY UR: 311 mosm/kg (ref 300–900)

## 2018-01-23 LAB — OSMOLALITY
OSMOLALITY: 251 mosm/kg — AB (ref 275–295)
Osmolality: 227 mOsm/kg — CL (ref 275–295)

## 2018-01-23 LAB — SODIUM: SODIUM: 123 mmol/L — AB (ref 135–145)

## 2018-01-23 MED ORDER — SODIUM CHLORIDE 3 % IV SOLN: INTRAVENOUS | Status: DC

## 2018-01-23 MED ORDER — ENOXAPARIN SODIUM 300 MG/3ML IJ SOLN
20.0000 mg | INTRAMUSCULAR | Status: DC
  Administered 2018-01-23 – 2018-01-28 (×5): 20 mg via SUBCUTANEOUS
  Filled 2018-01-23 (×6): qty 0.2

## 2018-01-23 MED ORDER — DEXTROSE 5 % IV SOLN
INTRAVENOUS | Status: DC
  Administered 2018-01-23: 16:00:00 via INTRAVENOUS

## 2018-01-23 MED ORDER — CLONAZEPAM 0.5 MG PO TABS: 0.2500 mg | ORAL_TABLET | Freq: Once | ORAL | Status: DC

## 2018-01-23 MED ORDER — BOOST PLUS PO LIQD
237.0000 mL | Freq: Three times a day (TID) | ORAL | Status: DC
  Administered 2018-01-23 – 2018-01-28 (×16): 237 mL via ORAL
  Filled 2018-01-23 (×20): qty 237

## 2018-01-23 MED ORDER — CLONAZEPAM 0.25 MG PO TBDP
0.2500 mg | ORAL_TABLET | Freq: Once | ORAL | Status: AC
  Administered 2018-01-23: 0.25 mg via ORAL

## 2018-01-23 MED ORDER — CLONAZEPAM 0.1 MG/ML ORAL SUSPENSION: 0.1250 mg | Freq: Two times a day (BID) | ORAL | Status: DC | PRN

## 2018-01-23 MED ORDER — POTASSIUM CHLORIDE 10 MEQ/100ML IV SOLN
10.0000 meq | INTRAVENOUS | Status: AC
  Administered 2018-01-23 (×4): 10 meq via INTRAVENOUS
  Filled 2018-01-23 (×4): qty 100

## 2018-01-23 NOTE — Progress Notes (Signed)
  Echocardiogram 2D Echocardiogram has been performed.  Julie [name] [name] [name] 01/23/2018, 1:23 PM

## 2018-01-23 NOTE — Progress Notes (Signed)
[facility] P[facility]  Na up to 123 Will start D5 infusion to mitigate further rise  Roselie Awkward, MD [facility] Pager: [phone] Cell: [phone] After 3pm or if no response, call [phone]

## 2018-01-23 NOTE — Progress Notes (Signed)
Julie [name]  VZD:[id number] DOB: 12/23/1930 DOA: 01/22/2018 PCP: Hayden Rasmussen, MD    LOS: 1 day   Reason for Consult / Chief Complaint:  Confusion  Consulting MD and date of consult:  Opyd  HPI/Summary of hospital stay    84 y/o female with presented with confusion, hyponatremia.  Best Practice / Goals of Care / Disposition.   DVT prophylaxis: lovenox GI prophylaxis: n/a Diet: npo Mobility:bed rest until mental status improves Code Status: DNR Family Communication: daughter updated bedside  Consults: (date of consult, sign off date and final recs)    Procedures:   Significant Diagnostic Tests:   Micro Data:   Antimicrobials:     Subjective  Moved to ICU overnight   Objective   BP 132/78   Pulse 75   Temp 98.7 F (37.1 C) (Oral)   Resp (!) 25   Ht 5\' 3"  (1.6 m)   Wt 41 kg   SpO2 99%   BMI 16.01 kg/m   Intake/Output Summary (Last 24 hours) at 01/23/2018 0829 Last data filed at 01/23/2018 0700 Gross per 24 hour  Intake 1944.31 ml  Output 2050 ml  Net -105.69 ml         Filed Weights   01/22/18 1223 01/22/18 2245  Weight: 36.3 kg 41 kg     Examination: General:  Resting comfortably in bed HENT: NCAT OP clear PULM: CTA B, normal effort CV: RRR, no mgr GI: BS+, soft, nontender MSK: normal bulk and tone Neuro: awake, alert, no distress, MAEW   Labs   CBC: Recent Labs  Lab 01/22/18 1444 01/23/18 0308  WBC 8.8 7.8  NEUTROABS 7.4  --   HGB 12.4 11.5*  HCT 33.3* 31.8*  MCV 87.6 89.6  PLT 251 295   Basic Metabolic Panel: Recent Labs  Lab 01/22/18 1229 01/22/18 1854 01/22/18 2202 01/23/18 0102 01/23/18 0308  NA 109* 109* 111* 112* 117*  K 3.1* 3.4* 2.7* 2.5* 3.3*  CL 68* 65* 74* 73* 85*  CO2 27 30 26 28 22   GLUCOSE 104* 102* 92 79 82  BUN 12 10 9 9 8   CREATININE 0.56 0.52 0.55 0.50 0.49  CALCIUM 8.7* 8.9 7.8* 8.0* 7.7*  MG 1.6*  --   --   --   --    GFR: Estimated Creatinine Clearance: 33.3 mL/min (by C-G formula  based on SCr of 0.49 mg/dL). Recent Labs  Lab 01/22/18 1349 01/22/18 1408 01/22/18 1444 01/22/18 1916 01/23/18 0308  WBC  --   --  8.8  --  7.8  LATICACIDVEN 1.47 1.86  --  1.03  --    Liver Function Tests: Recent Labs  Lab 01/22/18 1229  AST 54*  ALT 35  ALKPHOS 62  BILITOT 1.3*  PROT 6.6  ALBUMIN 3.8   No results for input(s): LIPASE, AMYLASE in the last 168 hours. No results for input(s): AMMONIA in the last 168 hours. ABG No results found for: PHART, PCO2ART, PO2ART, HCO3, TCO2, ACIDBASEDEF, O2SAT  Coagulation Profile: No results for input(s): INR, PROTIME in the last 168 hours. Cardiac Enzymes: Recent Labs  Lab 01/22/18 1854 01/22/18 2202  TROPONINI <0.03 <0.03   HbA1C: Hgb A1c MFr Bld  Date/Time Value Ref Range Status  01/22/2018 06:54 PM 5.1 4.8 - 5.6 % Final    Comment:    (NOTE) Pre diabetes:          5.7%-6.4% Diabetes:              >  6.4% Glycemic control for   <7.0% adults with diabetes    CBG: Recent Labs  Lab 01/22/18 2027 01/22/18 2145  GLUCAP 104* 100*    Review of Systems:     Past medical history  She,  has a past medical history of Anxiety, Cancer ([place]), and Hypertension.   Surgical History   History reviewed. No pertinent surgical history.   Social History   Social History   Socioeconomic History  . Marital status: Divorced    Spouse name: Not on file  . Number of children: Not on file  . Years of education: Not on file  . Highest education level: Not on file  Occupational History  . Not on file  Social Needs  . Financial resource strain: Not on file  . Food insecurity:    Worry: Not on file    Inability: Not on file  . Transportation needs:    Medical: Not on file    Non-medical: Not on file  Tobacco Use  . Smoking status: Never Smoker  . Smokeless tobacco: Never Used  Substance and Sexual Activity  . Alcohol use: Never    Frequency: Never  . Drug use: Never  . Sexual activity: Not Currently  Lifestyle  .  Physical activity:    Days per week: Not on file    Minutes per session: Not on file  . Stress: Not on file  Relationships  . Social connections:    Talks on phone: Not on file    Gets together: Not on file    Attends religious service: Not on file    Active member of club or organization: Not on file    Attends meetings of clubs or organizations: Not on file    Relationship status: Not on file  . Intimate partner violence:    Fear of current or ex partner: Not on file    Emotionally abused: Not on file    Physically abused: Not on file    Forced sexual activity: Not on file  Other Topics Concern  . Not on file  Social History Narrative  . Not on file  ,  reports that she has never smoked. She has never used smokeless tobacco. She reports that she does not drink alcohol or use drugs.   Family history   Her family history is negative for Breast cancer.   Allergies Allergies  Allergen Reactions  . Nitrofurantoin Monohyd Macro Nausea Only    Home meds  Prior to Admission medications   Medication Sig Start Date End Date Taking? Authorizing Provider  chlorthalidone (HYGROTON) 25 MG tablet Take 25 mg by mouth daily. Extended release 12/31/17  Yes [provider]  clonazePAM (KLONOPIN) 0.5 MG tablet Take 0.0125 mg by mouth as needed for anxiety.  12/18/17  Yes [provider]  KLOR-CON M20 20 MEQ tablet Take 10 mEq by mouth daily. 11/09/17  Yes [provider]  losartan (COZAAR) 25 MG tablet Take 25 mg by mouth daily. 01/10/18  Yes [provider]  mirtazapine (REMERON) 30 MG tablet Take 15 mg by mouth at bedtime. 12/31/17  Yes [provider]  lisinopril (PRINIVIL,ZESTRIL) 10 MG tablet Take 1 tablet (10 mg total) by mouth daily. 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 Doesn't want breast mass biopsied.  Symptomatic hyponatremia/SIADH: Free water restrict Hold hypertonic saline Monitor sodium with labs, monitor mental status closely Hold remeron Hold HCTZ  Hypertension: Monitor Hold HCTZ  Acute encephalopathy: MRI brain 9/13 ICU monitoring Hold clonazepam  Breast mass: D/c biopsy per family/patient request  Code status: DNR  Diet: regular   Disposition / Summary of Today's Plan 01/23/18   Remain in ICU  Case discussed with the patient's PCP  Roselie Awkward, MD [facility] Pager: [phone] Cell: [phone] After 3pm or if no response, call [phone]

## 2018-01-23 NOTE — Progress Notes (Signed)
OT Cancellation Note  Patient Details Name: Julie [name] MRN: [id number] DOB: 1930-11-12   Cancelled Treatment:    Reason Eval/Treat Not Completed: Active bedrest order. Pt with strict bedrest orders, and Dr Anastasia Pall note from this am indicates that pt should remain on bedrest until mental status cleared.  Will check back.  Lucille Passy, OTR/L [facility] Pager [phone] Office [phone]   Lucille Passy M 01/23/2018, 9:25 AM

## 2018-01-23 NOTE — Progress Notes (Addendum)
Initial Nutrition Assessment  DOCUMENTATION CODES:   Severe malnutrition in context of social or environmental circumstances, Underweight  INTERVENTION:    Boost Plus PO TID, each supplement provides 360 kcal and 14 gm protein  Magic cup TID with meals, each supplement provides 290 kcal and 9 grams of protein  NUTRITION DIAGNOSIS:   Severe Malnutrition related to social / environmental circumstances(lives alone and forgets to eat) as evidenced by severe fat depletion, severe muscle depletion.  GOAL:   Patient will meet greater than or equal to 90% of their needs  MONITOR:   PO intake, Supplement acceptance  REASON FOR ASSESSMENT:   Consult Assessment of nutrition requirement/status, Poor PO  ASSESSMENT:   84 yo female with PMH of HTN, cancer, and anxiety who was admitted on 9/11 with confusion and hyponatremia.   Spoke with patient's grandson and her daughter's friend in room with patient. They think she has been eating poorly for a while and that's why she is here. She lives alone and forgets to eat. She does not like milk or yogurt, but does like cheese. She is not lactose intolerant. She is also vegetarian. They have been ordering her meals. She ate half of an egg salad sandwich for lunch today. She remained asleep during RD visit, including NFPE.  Labs reviewed. Sodium 123 (L), potassium 3 (L) Medications reviewed. IVF: D5 at 50 ml/h  NUTRITION - FOCUSED PHYSICAL EXAM:    Most Recent Value  Orbital Region  Severe depletion  Upper Arm Region  Moderate depletion  Thoracic and Lumbar Region  Severe depletion  Buccal Region  Moderate depletion  Temple Region  Severe depletion  Clavicle Bone Region  Severe depletion  Clavicle and Acromion Bone Region  Severe depletion  Scapular Bone Region  Moderate depletion  Dorsal Hand  Mild depletion  Patellar Region  Moderate depletion  Anterior Thigh Region  Moderate depletion  Posterior Calf Region  Moderate depletion   Edema (RD Assessment)  None  Hair  Reviewed  Eyes  Unable to assess  Mouth  Unable to assess  Skin  Reviewed  Nails  Reviewed       Diet Order:   Diet Order            Diet vegetarian Room service appropriate? Yes; Fluid consistency: Thin  Diet effective now              EDUCATION NEEDS:   No education needs have been identified at this time  Skin:  Skin Assessment: Reviewed RN Assessment  Last BM:  9/11  Height:   Ht Readings from Last 1 Encounters:  01/22/18 5\' 3"  (1.6 m)    Weight:   Wt Readings from Last 1 Encounters:  01/22/18 41 kg    Ideal Body Weight:  52.3 kg  BMI:  Body mass index is 16.01 kg/m.  Estimated Nutritional Needs:   Kcal:  1300-1500  Protein:  70-80 gm  Fluid:  1.5 L    Molli Barrows, RD, LDN, [facility] Pager [phone] After Hours Pager [phone]

## 2018-01-23 NOTE — Progress Notes (Signed)
PT Cancellation Note  Patient Details Name: Julie [name] MRN: [id number] DOB: Nov 11, 1930   Cancelled Treatment:    Reason Eval/Treat Not Completed: (P) Patient not medically ready Pt on strict bed rest orders and Dr Anastasia Pall note this am indicates pt should be on bed rest until AMS clears. PT will follow back.   Beda Dula B. Migdalia Dk PT, DPT Acute Rehabilitation Services Pager [phone] Office [phone]  [place] 01/23/2018, 9:41 AM

## 2018-01-23 NOTE — Clinical Social Work Note (Signed)
Clinical Social Work Assessment  Patient Details  Name: Julie [name] MRN: [id number] Date of Birth: [birth year]  Date of referral:  01/23/18               Reason for consult:  Facility Placement, Discharge Planning                Permission sought to share information with:  Family Supports Permission granted to share information::  Yes, Release of Information Signed  Name::     [place]::  family   Relationship::  daughter   Contact Information:  Ginger Danker [phone]  Housing/Transportation Living arrangements for the past 2 months:  Single Family Home(alone. ) Source of Information:  Adult Children(Ginger) Patient Interpreter Needed:  None Criminal Activity/Legal Involvement Pertinent to Current Situation/Hospitalization:  No - Comment as needed Significant Relationships:  Adult Children Lives with:  Self Do you feel safe going back to the place where you live?  Yes Need for family participation in patient care:  Yes (Comment)  Care giving concerns:  CSW consulted in the ED as MD expressed pt needing further services at home.    Social Worker assessment / plan:  CSW went to speak with pt and daughter at bedside, however pt had been taken to another room at that time. CSW spoke with pt's daughter Ginger who expressed to CSW that she is pt's POA. CSW was informed that pt is from home alone and has lived alone for some time. Ginger expressed that pt is needing further services in the home in order to remain independent. CSW spoke with Ginger about the possibility of placement however Ginger expressed that "mom wouldn't like that. Her doctor mentioned it to her but she's stubborn and isn't having that, plus I think she would go down hill from there as we had that happen to another family member". CSW expressed verbal understanding to daughter and sought further information on pt and Ginger's wishes at this time. Ginger expressed that's he was interested in home health  services and spoke with Norberto Sorenson at [facility] ED to set this up.  During this assessment Ginger appeared to be calm and cooperative and able to answer questions asked by CSW.   Employment status:  Retired Nurse, adult PT Recommendations:  Not assessed at this time Information / Referral to community resources:  [place], Other (Comment Required)(home health )  Patient/Family's Response to care:  Ginger's response to care appeared agreeable and satisfied with RNCM setting up home health services for pt so that pt can return home once medically stable.   Patient/Family's Understanding of and Emotional Response to Diagnosis, Current Treatment, and Prognosis:  At this time CSW being made aware that pt may not be able to return home as pt is slowly becoming more unable to care for self as needed. CSW will continue to follow pt and family for further wishes at this time. CSW to offer emotional support to pt and family as needed.   Emotional Assessment Appearance:  Appears stated age Attitude/Demeanor/Rapport:  Unable to Assess Affect (typically observed):  Unable to Assess Orientation:  Oriented to Self, Oriented to Place Alcohol / Substance use:  Not Applicable Psych involvement (Current and /or in the community):  No (Comment)  Discharge Needs  Concerns to be addressed:  Basic Needs, Home Safety Concerns Readmission within the last 30 days:  No Current discharge risk:  Dependent with Mobility, Physical Impairment Barriers to Discharge:  Continued  Medical Work up   [employer], [place] 01/23/2018, 7:23 AM

## 2018-01-23 NOTE — Progress Notes (Signed)
Pt states she has to urinate but is unable to. Pt placed on bedpan, and purewick in place. Patient bladder scanned. Revealed greater than 800cc in bladder. MD paged. Order received for foley.

## 2018-01-24 ENCOUNTER — Inpatient Hospital Stay (HOSPITAL_COMMUNITY): Payer: Medicare Other

## 2018-01-24 DIAGNOSIS — E43 Unspecified severe protein-calorie malnutrition: Secondary | ICD-10-CM

## 2018-01-24 DIAGNOSIS — N6320 Unspecified lump in the left breast, unspecified quadrant: Secondary | ICD-10-CM

## 2018-01-24 DIAGNOSIS — M79609 Pain in unspecified limb: Secondary | ICD-10-CM

## 2018-01-24 DIAGNOSIS — R4189 Other symptoms and signs involving cognitive functions and awareness: Secondary | ICD-10-CM

## 2018-01-24 DIAGNOSIS — M79605 Pain in left leg: Secondary | ICD-10-CM

## 2018-01-24 LAB — COMPREHENSIVE METABOLIC PANEL
ALK PHOS: 59 U/L (ref 38–126)
ALT: 27 U/L (ref 0–44)
AST: 30 U/L (ref 15–41)
Albumin: 3.1 g/dL — ABNORMAL LOW (ref 3.5–5.0)
Anion gap: 11 (ref 5–15)
BILIRUBIN TOTAL: 0.6 mg/dL (ref 0.3–1.2)
BUN: 7 mg/dL — ABNORMAL LOW (ref 8–23)
CALCIUM: 8.4 mg/dL — AB (ref 8.9–10.3)
CO2: 25 mmol/L (ref 22–32)
Chloride: 82 mmol/L — ABNORMAL LOW (ref 98–111)
Creatinine, Ser: 0.56 mg/dL (ref 0.44–1.00)
GFR calc non Af Amer: >60 mL/min (ref >60)
Glucose, Bld: 167 mg/dL — ABNORMAL HIGH (ref 70–99)
Potassium: 2.7 mmol/L — CL (ref 3.5–5.1)
Sodium: 118 mmol/L — CL (ref 135–145)
TOTAL PROTEIN: 5.3 g/dL — AB (ref 6.5–8.1)

## 2018-01-24 LAB — CBC
HEMATOCRIT: 32.1 % — AB (ref 36.0–46.0)
HEMOGLOBIN: 11.7 g/dL — AB (ref 12.0–15.0)
MCH: 33.2 pg (ref 26.0–34.0)
MCHC: 36.4 g/dL — ABNORMAL HIGH (ref 30.0–36.0)
MCV: 91.2 fL (ref 78.0–100.0)
Platelets: 225 10*3/uL (ref 150–400)
RBC: 3.52 MIL/uL — ABNORMAL LOW (ref 3.87–5.11)
RDW: 12.2 % (ref 11.5–15.5)
WBC: 10.9 10*3/uL — ABNORMAL HIGH (ref 4.0–10.5)

## 2018-01-24 MED ORDER — POTASSIUM CHLORIDE 20 MEQ/15ML (10%) PO SOLN
40.0000 meq | ORAL | Status: AC
  Administered 2018-01-24 (×2): 40 meq via ORAL
  Filled 2018-01-24 (×2): qty 30

## 2018-01-24 MED ORDER — POTASSIUM CHLORIDE 10 MEQ/100ML IV SOLN
10.0000 meq | INTRAVENOUS | Status: AC
  Administered 2018-01-24 (×2): 10 meq via INTRAVENOUS
  Filled 2018-01-24 (×2): qty 100

## 2018-01-24 MED ORDER — SODIUM CHLORIDE 0.9 % IV SOLN
INTRAVENOUS | Status: DC
  Administered 2018-01-24 – 2018-01-25 (×2): via INTRAVENOUS

## 2018-01-24 NOTE — Progress Notes (Signed)
CRITICAL VALUE ALERT  Critical Value:  Na 118 and  K 2.7  Date & Time Notied:  01/24/18 11 am   Provider Notified: Eric Form NP   Orders Received/Actions taken: 2 runs of K and 40 mEq oral for 2 dose every 3 hours

## 2018-01-24 NOTE — Progress Notes (Addendum)
Preliminary note---Bilateral lower extremities venous duplex exam completed. Negative for DVT.   Incidental finding: 0.78x2.16cm complex fluid collection seen at popliteal fossa.  Hongying Maycee Blasco (RDMS RVT) 01/24/18 2:30 PM

## 2018-01-24 NOTE — Progress Notes (Addendum)
Julie [name]  JOA:[id number] DOB: 1931-01-12 DOA: 01/22/2018 PCP: Hayden Rasmussen, MD    LOS: 2 days   Reason for Consult / Chief Complaint:  Confusion  Consulting MD and date of consult:  Opyd  HPI/Summary of hospital stay    84 y/o female with presented with confusion, hyponatremia.  Best Practice / Goals of Care / Disposition.   DVT prophylaxis: lovenox GI prophylaxis: n/a Diet: npo Mobility:bed rest until mental status improves Code Status: DNR Family Communication: daughter updated bedside  Consults: (date of consult, sign off date and final recs)    Procedures:   Significant Diagnostic Tests:   Micro Data: PCR : Negative  Antimicrobials:    Subjective  Remains in ICU Remains confused per family Complaining  Of L leg pain   Objective   BP (!) 150/76 (BP Location: Left Arm)   Pulse 64   Temp 98.4 F (36.9 C) (Oral)   Resp 17   Ht 5\' 3"  (1.6 m)   Wt 41 kg   SpO2 98%   BMI 16.01 kg/m   Intake/Output Summary (Last 24 hours) at 01/24/2018 1003 Last data filed at 01/24/2018 0843 Gross per 24 hour  Intake 1672.72 ml  Output 1775 ml  Net -102.28 ml         Filed Weights   01/22/18 1223 01/22/18 2245  Weight: 36.3 kg 41 kg     Examination: General:  Resting comfortably in bed on RA with sats of 97% HENT: NCAT, NO LAD PULM: Bilateral excursion, Clear throughout CV: S1, S2, RRR, no mgr, pulses palpable GI: BS+, soft, nontender MSK: frail, thin, no obvious deformities Neuro: awake and alert, MAE x 4, A&O x 3   Labs   CBC: Recent Labs  Lab 01/22/18 1444 01/23/18 0308  WBC 8.8 7.8  NEUTROABS 7.4  --   HGB 12.4 11.5*  HCT 33.3* 31.8*  MCV 87.6 89.6  PLT 251 601   Basic Metabolic Panel: Recent Labs  Lab 01/22/18 1229  01/22/18 2202 01/23/18 0102 01/23/18 0308 01/23/18 0905 01/23/18 1352 01/23/18 1914  NA 109*   < > 111* 112* 117* 123* 123* 123*  K 3.1*   < > 2.7* 2.5* 3.3* 3.2* 3.0*  --   CL 68*   < > 74* 73* 85* 91* 93*   --   CO2 27   < > 26 28 22 24 23   --   GLUCOSE 104*   < > 92 79 82 90 118*  --   BUN 12   < > 9 9 8  7* 8  --   CREATININE 0.56   < > 0.55 0.50 0.49 0.50 0.58  --   CALCIUM 8.7*   < > 7.8* 8.0* 7.7* 7.9* 7.9*  --   MG 1.6*  --   --   --   --   --   --   --    < > = values in this interval not displayed.   GFR: Estimated Creatinine Clearance: 33.3 mL/min (by C-G formula based on SCr of 0.58 mg/dL). Recent Labs  Lab 01/22/18 1349 01/22/18 1408 01/22/18 1444 01/22/18 1916 01/23/18 0308  WBC  --   --  8.8  --  7.8  LATICACIDVEN 1.47 1.86  --  1.03  --    Liver Function Tests: Recent Labs  Lab 01/22/18 1229  AST 54*  ALT 35  ALKPHOS 62  BILITOT 1.3*  PROT 6.6  ALBUMIN 3.8   No results  for input(s): LIPASE, AMYLASE in the last 168 hours. No results for input(s): AMMONIA in the last 168 hours. ABG No results found for: PHART, PCO2ART, PO2ART, HCO3, TCO2, ACIDBASEDEF, O2SAT  Coagulation Profile: No results for input(s): INR, PROTIME in the last 168 hours. Cardiac Enzymes: Recent Labs  Lab 01/22/18 1854 01/22/18 2202  TROPONINI <0.03 <0.03   HbA1C: Hgb A1c MFr Bld  Date/Time Value Ref Range Status  01/22/2018 06:54 PM 5.1 4.8 - 5.6 % Final    Comment:    (NOTE) Pre diabetes:          5.7%-6.4% Diabetes:              >6.4% Glycemic control for   <7.0% adults with diabetes    CBG: Recent Labs  Lab 01/22/18 2027 01/22/18 2145  GLUCAP 104* 100*    Review of Systems:     Past medical history  She,  has a past medical history of Anxiety, Cancer ([place]), and Hypertension.   Surgical History   History reviewed. No pertinent surgical history.   Social History   Social History   Socioeconomic History  . Marital status: Divorced    Spouse name: Not on file  . Number of children: Not on file  . Years of education: Not on file  . Highest education level: Not on file  Occupational History  . Not on file  Social Needs  . Financial resource strain: Not on  file  . Food insecurity:    Worry: Not on file    Inability: Not on file  . Transportation needs:    Medical: Not on file    Non-medical: Not on file  Tobacco Use  . Smoking status: Never Smoker  . Smokeless tobacco: Never Used  Substance and Sexual Activity  . Alcohol use: Never    Frequency: Never  . Drug use: Never  . Sexual activity: Not Currently  Lifestyle  . Physical activity:    Days per week: Not on file    Minutes per session: Not on file  . Stress: Not on file  Relationships  . Social connections:    Talks on phone: Not on file    Gets together: Not on file    Attends religious service: Not on file    Active member of club or organization: Not on file    Attends meetings of clubs or organizations: Not on file    Relationship status: Not on file  . Intimate partner violence:    Fear of current or ex partner: Not on file    Emotionally abused: Not on file    Physically abused: Not on file    Forced sexual activity: Not on file  Other Topics Concern  . Not on file  Social History Narrative  . Not on file  ,  reports that she has never smoked. She has never used smokeless tobacco. She reports that she does not drink alcohol or use drugs.   Family history   Her family history is negative for Breast cancer.   Allergies Allergies  Allergen Reactions  . Nitrofurantoin Monohyd Macro Nausea Only    Home meds  Prior to Admission medications   Medication Sig Start Date End Date Taking? Authorizing Provider  chlorthalidone (HYGROTON) 25 MG tablet Take 25 mg by mouth daily. Extended release 12/31/17  Yes [provider]  clonazePAM (KLONOPIN) 0.5 MG tablet Take 0.0125 mg by mouth as needed for anxiety.  12/18/17  Yes [provider]  Rebeca Allegra  M20 20 MEQ tablet Take 10 mEq by mouth daily. 11/09/17  Yes [provider]  losartan (COZAAR) 25 MG tablet Take 25 mg by mouth daily. 01/10/18  Yes [provider]  mirtazapine (REMERON) 30 MG  tablet Take 15 mg by mouth at bedtime. 12/31/17  Yes [provider]  lisinopril (PRINIVIL,ZESTRIL) 10 MG tablet Take 1 tablet (10 mg total) by mouth daily. 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 Doesn't want breast mass biopsied.    Symptomatic hyponatremia/SIADH: Hypokalemia Free water restrict Start NS at 75/ hr Serum Na Q 6 hours Monitor sodium with labs, monitor mental status closely Monitor and replete electrolytes Hold remeron Hold HCTZ  Hypertension: Monitor Hold HCTZ Consider adding Lopressor prn for BP > 218 systolic  Acute encephalopathy: CT negative for acute process MRI brain 9/13>> pending ICU monitoring Hold clonazepam  Breast mass: D/c biopsy per family/patient request  L Leg pain Breast mass with no tissue diagnosis>> Increased risk VTE Plan: Will check LE dopplers  Code status: DNR  Diet: regular   Disposition / Summary of Today's Plan 01/24/18   Remain in ICU Will get LE dopplers as patient is complaining of L  leg cramping and pain and is higher risk due to breast mass Na 118 K 2.7 Will add Q 6 hour sodium monitoring Per family MS is worse today than yesterday  Family updated at bedside 01/24/2018  Magdalen Spatz, AGACNP-BC [place] [facility] Pager: [phone] 01/24/2018  10:04 AM

## 2018-01-24 NOTE — Progress Notes (Signed)
PT Cancellation Note  Patient Details Name: Arrow Emmerich MRN: [id number] DOB: June 11, 1930   Cancelled Treatment:    Reason Eval/Treat Not Completed: (P) Patient not medically ready Pt continues to be on strict bedrest. Will check back later for appropriateness.   Jesly Hartmann B. Migdalia Dk PT, DPT Acute Rehabilitation Services Pager [phone] Office [phone]  [place] 01/24/2018, 8:06 AM

## 2018-01-24 NOTE — Progress Notes (Signed)
OT Cancellation Note  Patient Details Name: Julie [name] MRN: [id number] DOB: 1931-04-08   Cancelled Treatment:    Reason Eval/Treat Not Completed: Active bedrest order.  Will follow along for readiness.  Lucille Passy, OTR/L [place] Pager [phone] Office [phone]   Lucille Passy M 01/24/2018, 5:33 AM

## 2018-01-25 LAB — COMPREHENSIVE METABOLIC PANEL
ALK PHOS: 49 U/L (ref 38–126)
ALT: 26 U/L (ref 0–44)
AST: 25 U/L (ref 15–41)
Albumin: 2.9 g/dL — ABNORMAL LOW (ref 3.5–5.0)
Anion gap: 5 (ref 5–15)
BILIRUBIN TOTAL: 0.6 mg/dL (ref 0.3–1.2)
BUN: 8 mg/dL (ref 8–23)
CALCIUM: 8.4 mg/dL — AB (ref 8.9–10.3)
CO2: 28 mmol/L (ref 22–32)
Chloride: 91 mmol/L — ABNORMAL LOW (ref 98–111)
Creatinine, Ser: 0.44 mg/dL (ref 0.44–1.00)
GFR calc Af Amer: >60 mL/min (ref >60)
GFR calc non Af Amer: >60 mL/min (ref >60)
GLUCOSE: 96 mg/dL (ref 70–99)
Potassium: 4.4 mmol/L (ref 3.5–5.1)
Sodium: 124 mmol/L — ABNORMAL LOW (ref 135–145)
TOTAL PROTEIN: 5.2 g/dL — AB (ref 6.5–8.1)

## 2018-01-25 LAB — BASIC METABOLIC PANEL WITH GFR
Anion gap: 11 (ref 5–15)
BUN: 10 mg/dL (ref 8–23)
CO2: 24 mmol/L (ref 22–32)
Calcium: 8.7 mg/dL — ABNORMAL LOW (ref 8.9–10.3)
Chloride: 86 mmol/L — ABNORMAL LOW (ref 98–111)
Creatinine, Ser: 0.48 mg/dL (ref 0.44–1.00)
GFR calc Af Amer: >60 mL/min
GFR calc non Af Amer: >60 mL/min
Glucose, Bld: 169 mg/dL — ABNORMAL HIGH (ref 70–99)
Potassium: 3.9 mmol/L (ref 3.5–5.1)
Sodium: 121 mmol/L — ABNORMAL LOW (ref 135–145)

## 2018-01-25 LAB — CBC
HEMATOCRIT: 32.1 % — AB (ref 36.0–46.0)
HEMOGLOBIN: 11.3 g/dL — AB (ref 12.0–15.0)
MCH: 32.8 pg (ref 26.0–34.0)
MCHC: 35.2 g/dL (ref 30.0–36.0)
MCV: 93 fL (ref 78.0–100.0)
Platelets: 199 10*3/uL (ref 150–400)
RBC: 3.45 MIL/uL — ABNORMAL LOW (ref 3.87–5.11)
RDW: 12.3 % (ref 11.5–15.5)
WBC: 9.7 10*3/uL (ref 4.0–10.5)

## 2018-01-25 MED ORDER — CLONAZEPAM 0.125 MG PO TBDP
0.2500 mg | ORAL_TABLET | Freq: Three times a day (TID) | ORAL | Status: DC | PRN
  Administered 2018-01-26 – 2018-01-28 (×3): 0.25 mg via ORAL
  Filled 2018-01-25 (×3): qty 2

## 2018-01-25 MED ORDER — CLONAZEPAM 0.5 MG PO TABS: 0.2500 mg | ORAL_TABLET | Freq: Three times a day (TID) | ORAL | Status: DC | PRN

## 2018-01-25 MED ORDER — MIRTAZAPINE 7.5 MG PO TABS
15.0000 mg | ORAL_TABLET | Freq: Every day | ORAL | Status: DC
Start: 2018-01-25 — End: 2018-01-27
  Administered 2018-01-25 – 2018-01-26 (×2): 15 mg via ORAL
  Filled 2018-01-25 (×2): qty 2
  Filled 2018-01-25: qty 1

## 2018-01-25 MED ORDER — ACETAMINOPHEN 325 MG PO TABS: 650.0000 mg | ORAL_TABLET | Freq: Four times a day (QID) | ORAL | Status: DC | PRN

## 2018-01-25 NOTE — Progress Notes (Signed)
[place] TEAM 1 - Stepdown/ICU TEAM  Tejasvi Brissett  ZHY:[id number] DOB: 03-31-1931 DOA: 01/22/2018 PCP: Hayden Rasmussen, MD    Brief Narrative:  84yo F w/ a Hx of HTN who presented w/ AMS and generalized weakness and was found to have a Na of 108.  Head CT showed no acute disease.    Subjective: The pt is alert and conversant, but mildly confused.  She is very anxious, and reports anxiety is a chronic issue for her.  She is not eating much and has not moved her bowels.  A foley remains in place.    Assessment & Plan:  Severe hyponatremia Due to combination diureitc + SIADH - free water restrict - Na appears to be stabilizing - cont to follow BID for now - resume remeron and follow   Recent Labs  Lab 01/23/18 0308 01/23/18 0905 01/23/18 1352 01/23/18 1914 01/24/18 1046 01/25/18 0356  NA 117* 123* 123* 123* 118* 124*    Acute encephalopathy Due mostly to above, w/ pt slowly improving - CT head unrevealing - MRI of head ordered by PCCM will not likely impact her care in any meaningful way - in absence of a focal neuro deficit I have canceled the MRI   Hypokalemia  Corrected - follow in serial fashion   Hypertension BP variable - avoid overaggressive control in this elderly patient - follow w/o change at this time   Transient newly appreciated Afib  Noted at time of admit - has spontaneously converted back to NSR during course of hospital stay - suspect this was incited by extreme electrolyte abnormalities - will not initiate anticoag at this time - cont to monitor on tele   Generalized anxiety d/o  Resume nightly remeron and prn daytime klonopin - if Na drops again will have to consider stopping remeron again   Urinary retention  Noted 01/23/18 - attempt to remove foley when pt more mobile   Normocytic anemia  Most likely due to severe malnutrition - check anemia panel but would not pursue GI eval if Fe deficiency noted   Breast mass Family and patient have declined  further evaluation   L Leg pain B LE dopplers 9/13 w/o evidence of DVT  Severe malnutrition - Underweight   DVT prophylaxis: lovenox  Code Status: DNR - NO CODE Family Communication: spoke w/ family at bedside at length   Disposition Plan: SDU until Na proven to be stable   Consultants:  PCCM  Antimicrobials:  none   Objective: Blood pressure (!) 149/65, pulse 73, temperature 98.6 F (37 C), temperature source Oral, resp. rate 18, height 5\' 3"  (1.6 m), weight 41 kg, SpO2 98 %.  Intake/Output Summary (Last 24 hours) at 01/25/2018 0909 Last data filed at 01/25/2018 0800 Gross per 24 hour  Intake 933.85 ml  Output 830 ml  Net 103.85 ml   Filed Weights   01/22/18 1223 01/22/18 2245  Weight: 36.3 kg 41 kg    Examination: General: No acute respiratory distress - mildly confused  Lungs: Clear to auscultation bilaterally without wheezes or crackles Cardiovascular: Regular rate and rhythm without murmur gallop or rub normal S1 and S2 Abdomen: Nontender, nondistended, soft, bowel sounds positive, no rebound, no ascites, no appreciable mass Extremities: No significant cyanosis, clubbing, or edema bilateral lower extremities  CBC: Recent Labs  Lab 01/22/18 1444 01/23/18 0308 01/24/18 1046 01/25/18 0356  WBC 8.8 7.8 10.9* 9.7  NEUTROABS 7.4  --   --   --   HGB 12.4 11.5*  11.7* 11.3*  HCT 33.3* 31.8* 32.1* 32.1*  MCV 87.6 89.6 91.2 93.0  PLT 251 227 225 158   Basic Metabolic Panel: Recent Labs  Lab 01/22/18 1229  01/23/18 1352 01/23/18 1914 01/24/18 1046 01/25/18 0356  NA 109*   < > 123* 123* 118* 124*  K 3.1*   < > 3.0*  --  2.7* 4.4  CL 68*   < > 93*  --  82* 91*  CO2 27   < > 23  --  25 28  GLUCOSE 104*   < > 118*  --  167* 96  BUN 12   < > 8  --  7* 8  CREATININE 0.56   < > 0.58  --  0.56 0.44  CALCIUM 8.7*   < > 7.9*  --  8.4* 8.4*  MG 1.6*  --   --   --   --   --    < > = values in this interval not displayed.   GFR: Estimated Creatinine Clearance:  33.3 mL/min (by C-G formula based on SCr of 0.44 mg/dL).  Liver Function Tests: Recent Labs  Lab 01/22/18 1229 01/24/18 1046 01/25/18 0356  AST 54* 30 25  ALT 35 27 26  ALKPHOS 62 59 49  BILITOT 1.3* 0.6 0.6  PROT 6.6 5.3* 5.2*  ALBUMIN 3.8 3.1* 2.9*    Cardiac Enzymes: Recent Labs  Lab 01/22/18 1854 01/22/18 2202  TROPONINI <0.03 <0.03    HbA1C: Hgb A1c MFr Bld  Date/Time Value Ref Range Status  01/22/2018 06:54 PM 5.1 4.8 - 5.6 % Final    Comment:    (NOTE) Pre diabetes:          5.7%-6.4% Diabetes:              >6.4% Glycemic control for   <7.0% adults with diabetes     CBG: Recent Labs  Lab 01/22/18 2027 01/22/18 2145  GLUCAP 104* 100*    Recent Results (from the past 240 hour(s))  MRSA PCR Screening     Status: None   Collection Time: 01/22/18  9:52 PM  Result Value Ref Range Status   MRSA by PCR NEGATIVE NEGATIVE Final    Comment:        The GeneXpert MRSA Assay (FDA approved for NASAL specimens only), is one component of a comprehensive MRSA colonization surveillance program. It is not intended to diagnose MRSA infection nor to guide or monitor treatment for MRSA infections. Performed at [facility], [place] [address], [place], [place] [address]      Scheduled Meds: . enoxaparin (LOVENOX) injection  20 mg Subcutaneous Q24H  . lactose free nutrition  237 mL Oral TID WC   Continuous Infusions: . sodium chloride 10 mL/hr at 01/25/18 0800     LOS: 3 days   Cherene Altes, MD [facility] Office  [phone] Pager - Text Page per Shea Evans  If 7PM-7AM, please contact night-coverage per Amion 01/25/2018, 9:09 AM

## 2018-01-25 NOTE — Progress Notes (Signed)
PT Cancellation Note  Patient Details Name: Julie [name] MRN: [id number] DOB: 1930-08-20   Cancelled Treatment:    Reason Eval/Treat Not Completed: Patient not medically ready. Pt remains on strict bed rest at this time. PT will continue to follow acutely and await updated activity orders.   [place] 01/25/2018, 7:43 AM

## 2018-01-26 LAB — FOLATE: Folate: 8.7 ng/mL (ref >5.9)

## 2018-01-26 LAB — FERRITIN: FERRITIN: 85 ng/mL (ref 11–307)

## 2018-01-26 LAB — BASIC METABOLIC PANEL
ANION GAP: 10 (ref 5–15)
BUN: 9 mg/dL (ref 8–23)
CALCIUM: 8.8 mg/dL — AB (ref 8.9–10.3)
CO2: 28 mmol/L (ref 22–32)
Chloride: 91 mmol/L — ABNORMAL LOW (ref 98–111)
Creatinine, Ser: 0.4 mg/dL — ABNORMAL LOW (ref 0.44–1.00)
GFR calc Af Amer: >60 mL/min (ref >60)
GLUCOSE: 96 mg/dL (ref 70–99)
Potassium: 4.2 mmol/L (ref 3.5–5.1)
Sodium: 129 mmol/L — ABNORMAL LOW (ref 135–145)

## 2018-01-26 LAB — IRON AND TIBC
Iron: 46 ug/dL (ref 28–170)
Saturation Ratios: 15 % (ref 10.4–31.8)
TIBC: 300 ug/dL (ref 250–450)
UIBC: 254 ug/dL

## 2018-01-26 LAB — MAGNESIUM: Magnesium: 1.7 mg/dL (ref 1.7–2.4)

## 2018-01-26 LAB — PHOSPHORUS: Phosphorus: 3.2 mg/dL (ref 2.5–4.6)

## 2018-01-26 LAB — RETICULOCYTES
RBC.: 3.54 MIL/uL — ABNORMAL LOW (ref 3.87–5.11)
RETIC COUNT ABSOLUTE: 56.6 10*3/uL (ref 19.0–186.0)
RETIC CT PCT: 1.6 % (ref 0.4–3.1)

## 2018-01-26 LAB — VITAMIN B12: Vitamin B-12: 2563 pg/mL — ABNORMAL HIGH (ref 180–914)

## 2018-01-26 MED ORDER — MAGNESIUM SULFATE 2 GM/50ML IV SOLN
2.0000 g | Freq: Once | INTRAVENOUS | Status: AC
  Administered 2018-01-26: 2 g via INTRAVENOUS
  Filled 2018-01-26: qty 50

## 2018-01-26 NOTE — Evaluation (Addendum)
Physical Therapy Evaluation Patient Details Name: Julie [name] MRN: [id number] DOB: 08-07-1930 Today's Date: 01/26/2018   History of Present Illness  84 y.o. female with medical history significant for hypertension, anxiety who presented to the ED today complaining of generalized weakness.  Found to have severe hyponatremia in setting of HCTZ use and apparent euvolemia, also with mass on L breast.  Clinical Impression  Pt admitted with above diagnosis. Pt currently with functional limitations due to the deficits listed below (see PT Problem List). Prior to admission, patient lives alone but is independent and denies history of falls. Has excellent family support. Currently, patient presenting with decreased functional mobility secondary to decreased activity tolerance and functional strength deficits. Ambulating 30 feet with walker displaying slow speed but no gross imbalance noted. Suspect patient will continue to progress well. Recommending home health PT but patient will need to be able to safely negotiate steps (daughter will be present to provide necessary assistance) prior to discharge. Pt will benefit from skilled PT to increase their independence and safety with mobility to allow discharge to the venue listed below.       Follow Up Recommendations Home health PT;Supervision for mobility/OOB (pending pt progress)    Equipment Recommendations  Rolling walker with 5" wheels (youth);3in1 (PT)    Recommendations for Other Services       Precautions / Restrictions Precautions Precautions: Fall Restrictions Weight Bearing Restrictions: No      Mobility  Bed Mobility Overal bed mobility: Modified Independent             General bed mobility comments: Increased time and effort but able to sit edge of bed without physical assistance  Transfers Overall transfer level: Needs assistance Equipment used: Rolling walker (2 wheeled);None Transfers: Sit to/from Stand Sit to Stand:  Min guard            Ambulation/Gait Ambulation/Gait assistance: Min guard Gait Distance (Feet): 30 Feet Assistive device: Rolling walker (2 wheeled);None Gait Pattern/deviations: Step-through pattern;Trunk flexed;Decreased stride length Gait velocity: decreased Gait velocity interpretation: <1.31 ft/sec, indicative of household ambulator General Gait Details: Patient slow speed but overall no overt LOB noted. cues for walker proximity  Stairs            Wheelchair Mobility    Modified Rankin (Stroke Patients Only)       Balance Overall balance assessment: Mild deficits observed, not formally tested                                           Pertinent Vitals/Pain Pain Assessment: No/denies pain    Home Living Family/patient expects to be discharged to:: Private residence Living Arrangements: Alone Available Help at Discharge: Family(daughter very involved) Type of Home: Apartment Home Access: Stairs to enter Entrance Stairs-Rails: Psychiatric nurse of Steps: (flight) Home Layout: One level Home Equipment: None      Prior Function Level of Independence: Needs assistance   Gait / Transfers Assistance Needed: Independent with community ambulation, does not drive. Always has supervision when negotiating steps  ADL's / Homemaking Assistance Needed: Independent        Hand Dominance        Extremity/Trunk Assessment   Upper Extremity Assessment Upper Extremity Assessment: Defer to OT evaluation    Lower Extremity Assessment Lower Extremity Assessment: Generalized weakness    Cervical / Trunk Assessment Cervical / Trunk Assessment: Kyphotic  Communication  Communication: No difficulties  Cognition Arousal/Alertness: Awake/alert Behavior During Therapy: WFL for tasks assessed/performed Overall Cognitive Status: Within Functional Limits for tasks assessed                                         General Comments      Exercises     Assessment/Plan    PT Assessment Patient needs continued PT services  PT Problem List Decreased strength;Decreased activity tolerance;Decreased balance;Decreased mobility;Decreased coordination       PT Treatment Interventions DME instruction;Gait training;Stair training;Functional mobility training;Therapeutic activities;Therapeutic exercise;Balance training;Patient/family education    PT Goals (Current goals can be found in the Care Plan section)  Acute Rehab PT Goals Patient Stated Goal: pt would like to go home and continue being independent PT Goal Formulation: With patient/family Time For Goal Achievement: 02/09/18 Potential to Achieve Goals: Good    Frequency Min 3X/week   Barriers to discharge Inaccessible home environment      Co-evaluation               AM-PAC PT "6 Clicks" Daily Activity  Outcome Measure Difficulty turning over in bed (including adjusting bedclothes, sheets and blankets)?: None Difficulty moving from lying on back to sitting on the side of the bed? : A Little Difficulty sitting down on and standing up from a chair with arms (e.g., wheelchair, bedside commode, etc,.)?: A Little Help needed moving to and from a bed to chair (including a wheelchair)?: A Little Help needed walking in hospital room?: A Little Help needed climbing 3-5 steps with a railing? : A Lot 6 Click Score: 18    End of Session Equipment Utilized During Treatment: Gait belt Activity Tolerance: Patient tolerated treatment well Patient left: in chair;with call bell/phone within reach;with family/visitor present Nurse Communication: Mobility status PT Visit Diagnosis: Muscle weakness (generalized) (M62.81);Difficulty in walking, not elsewhere classified (R26.2)    Time: 7412-8786 PT Time Calculation (min) (ACUTE ONLY): 35 min   Charges:   PT Evaluation $PT Eval Moderate Complexity: 1 Mod PT Treatments $Gait Training: 8-22 mins       Ellamae Sia, PT, DPT Acute Rehabilitation Services Pager [phone] Office [phone]   Willy Eddy 01/26/2018, 9:52 AM

## 2018-01-26 NOTE — Evaluation (Signed)
Occupational Therapy Evaluation Patient Details Name: Julie [name] MRN: [id number] DOB: 05/09/1931 Today's Date: 01/26/2018    History of Present Illness 84 y.o. female with medical history significant for hypertension, anxiety who presented to the ED today complaining of generalized weakness.  Found to have severe hyponatremia in setting of HCTZ use and apparent euvolemia, also with mass on L breast.   Clinical Impression   PTA, pt was independent with basic ADL and functional mobility. She lives alone but has assistance for meal preparation and community access from her daughters. She currently is limited by generalized weakness and decreased activity tolerance impacting her ability to participate in ADL and functional mobility at PLOF. She requires mod assist for toilet transfers from low commode and min guard assist for LB ADL. Pt would benefit from continued OT services while admitted to improve independence and safety with ADL and functional mobility. At current functional level, recommend SNF level rehabilitation to maximize functional participation prior to returning home. Pt's family expresses interest in [place], [place], or [facility] and OT expressed to daughter that therapist cannot make these arrangements but would put in note to pass along to social work staff. OT will continue to follow while admitted.     Follow Up Recommendations  SNF;Supervision/Assistance - 24 hour    Equipment Recommendations  Tub/shower bench;3 in 1 bedside commode    Recommendations for Other Services       Precautions / Restrictions Precautions Precautions: Fall Restrictions Weight Bearing Restrictions: No      Mobility Bed Mobility Overal bed mobility: Modified Independent             General bed mobility comments: Able to return to bed without assistance.   Transfers Overall transfer level: Needs assistance Equipment used: Rolling walker (2 wheeled);None Transfers: Sit  to/from Stand Sit to Stand: Min guard;Mod assist         General transfer comment: Min guard assist from recliner. Mod assist to power up to standing from commode with grab bar.     Balance Overall balance assessment: Mild deficits observed, not formally tested                                         ADL either performed or assessed with clinical judgement   ADL Overall ADL's : Needs assistance/impaired Eating/Feeding: Set up;Sitting   Grooming: Standing;Oral care;Min guard   Upper Body Bathing: Set up;Sitting   Lower Body Bathing: Min guard;Sit to/from stand Lower Body Bathing Details (indicate cue type and reason): significantly increased time Upper Body Dressing : Set up;Sitting   Lower Body Dressing: Min guard;Sit to/from stand Lower Body Dressing Details (indicate cue type and reason): significantly increased time Toilet Transfer: Minimal assistance;Ambulation;RW;Moderate assistance;Regular Toilet;Grab bars Toilet Transfer Details (indicate cue type and reason): Min assist with ambulation but mod assist with power up to standing from commode with grab bar.  Toileting- Clothing Manipulation and Hygiene: Minimal assistance;Sit to/from stand       Functional mobility during ADLs: Minimal assistance;Moderate assistance;Rolling walker General ADL Comments: Pt was able to stand from recliner (with armrests) with min guard assist but requires increased assist from low commode. Able to tolerate standing at sink for oral care tasks. HR up to 122 during session.      Vision Patient Visual Report: No change from baseline Vision Assessment?: No apparent visual deficits     Perception  Praxis      Pertinent Vitals/Pain Pain Assessment: No/denies pain     Hand Dominance     Extremity/Trunk Assessment Upper Extremity Assessment Upper Extremity Assessment: Generalized weakness   Lower Extremity Assessment Lower Extremity Assessment: Generalized  weakness       Communication Communication Communication: No difficulties   Cognition Arousal/Alertness: Awake/alert Behavior During Therapy: WFL for tasks assessed/performed Overall Cognitive Status: Within Functional Limits for tasks assessed Area of Impairment: Attention;Memory                   Current Attention Level: Alternating Memory: Decreased short-term memory             General Comments  Daughter present during session. She is very interested in her mother going to SNF prior to returning home. She would like to be called with all updates (her number is in chart and her name is Julie [name]).     Exercises     Shoulder Instructions      Home Living Family/patient expects to be discharged to:: Private residence Living Arrangements: Alone Available Help at Discharge: Family(daughters very involved) Type of Home: Apartment Home Access: Stairs to enter [employer] of Steps: (flight) Entrance Stairs-Rails: Right;Left Home Layout: One level     Bathroom Shower/Tub: Teacher, early years/pre: Standard     Home Equipment: None          Prior Functioning/Environment Level of Independence: Needs assistance  Gait / Transfers Assistance Needed: Independent with community ambulation, does not drive. Always has supervision when negotiating steps ADL's / Homemaking Assistance Needed: Independent            OT Problem List: Decreased strength;Decreased range of motion;Decreased activity tolerance;Impaired balance (sitting and/or standing);Decreased safety awareness;Decreased knowledge of use of DME or AE;Decreased knowledge of precautions      OT Treatment/Interventions: Self-care/ADL training;Therapeutic exercise;Energy conservation;DME and/or AE instruction;Therapeutic activities;Patient/family education;Balance training    OT Goals(Current goals can be found in the care plan section) Acute Rehab OT Goals Patient Stated Goal: to get  stronger and be independent OT Goal Formulation: With patient/family Time For Goal Achievement: 02/09/18 Potential to Achieve Goals: Good  OT Frequency: Min 2X/week   Barriers to D/C:            Co-evaluation              AM-PAC PT "6 Clicks" Daily Activity     Outcome Measure Help from another person eating meals?: None Help from another person taking care of personal grooming?: A Little Help from another person toileting, which includes using toliet, bedpan, or urinal?: A Lot Help from another person bathing (including washing, rinsing, drying)?: A Little Help from another person to put on and taking off regular upper body clothing?: None Help from another person to put on and taking off regular lower body clothing?: A Little 6 Click Score: 19   End of Session Equipment Utilized During Treatment: Rolling walker;Gait belt Nurse Communication: Mobility status;Other (comment)(RN removing foley during session)  Activity Tolerance: Patient tolerated treatment well Patient left: with call bell/phone within reach;in bed;with bed alarm set  OT Visit Diagnosis: Other abnormalities of gait and mobility (R26.89);Muscle weakness (generalized) (M62.81)                Time: 1062-6948 OT Time Calculation (min): 40 min Charges:  OT General Charges $OT Visit: 1 Visit OT Evaluation $OT Eval Moderate Complexity: 1 Mod OT Treatments $Self Care/Home Management : 23-37 mins  Kaci Dillie  Tobey Bride, OTR/L Acute Rehabilitation Services Pager [phone] Office [phone]   Norman Herrlich 01/26/2018, 3:59 PM

## 2018-01-26 NOTE — Progress Notes (Signed)
[place] TEAM 1 - Stepdown/ICU TEAM  Julie [name]  JYN:[id number] DOB: Jul 23, 1930 DOA: 01/22/2018 PCP: Hayden Rasmussen, MD    Brief Narrative:  84yo F w/ a Hx of HTN who presented w/ AMS and generalized weakness and was found to have a Na of 108.  Head CT showed no acute disease.    Subjective: Pt is much more alert and calm today. Appears to be very close to her baseline mental status. Reports anxiety is better controlled, though not resolved fully.  Denies cp, n/v, or abdom pain.  Is eating and moving her bowels.    Assessment & Plan:  Severe hyponatremia Due to combination diureitc + SIADH - free water restriction continues - Na appears to be stabilizing/improving - cont to follow daily - resumed remeron w/ no deleterious effect thus far - will NOT resume diuretic or ACEi  Recent Labs  Lab 01/23/18 1352 01/23/18 1914 01/24/18 1046 01/25/18 0356 01/25/18 1442 01/26/18 0656  NA 123* 123* 118* 124* 121* 129*    Acute encephalopathy CT head unrevealing - MRI of head ordered by PCCM will not likely impact her care in any meaningful way - in absence of a focal neuro deficit I canceled the MRI - B12 and folate are not low - mental status rapidly approaching baseline w/ improving Na  Hypokalemia  Corrected   Hypomagnesemia Modestly low - supplement and follow - likely due to use of diuretic   Hypertension avoid overaggressive control in this elderly patient - BP within acceptable range at this time   Transient newly appreciated Afib  Noted at time of admit - spontaneously converted back to NSR during course of hospital stay - suspect this was incited by extreme electrolyte abnormalities - will not initiate anticoag at this time - cont to monitor on tele   Generalized anxiety d/o  Resumed nightly remeron and prn daytime klonopin - if Na drops again will have to consider stopping remeron again   Urinary retention  Noted 01/23/18 - attempt to remove foley today and watch for  retention    Normocytic anemia  Most likely due to severe malnutrition - no Fe deficiency on labs - most c/w ACD   Breast mass Family and patient have declined further evaluation   L Leg pain B LE dopplers 9/13 w/o evidence of DVT  Severe malnutrition - Underweight Encouraged improved oral intake   DVT prophylaxis: lovenox  Code Status: DNR - NO CODE Family Communication: spoke w/ family at bedside  Disposition Plan: SDU until Na proven to be stable in normal range   Consultants:  PCCM  Antimicrobials:  none   Objective: Blood pressure 123/62, pulse 88, temperature 98 F (36.7 C), temperature source Oral, resp. rate 19, height 5\' 3"  (1.6 m), weight 37.1 kg, SpO2 97 %.  Intake/Output Summary (Last 24 hours) at 01/26/2018 1521 Last data filed at 01/26/2018 0800 Gross per 24 hour  Intake 394.09 ml  Output 1200 ml  Net -805.91 ml   Filed Weights   01/22/18 1223 01/22/18 2245 01/26/18 0553  Weight: 36.3 kg 41 kg 37.1 kg    Examination: General: No acute respiratory distress  Lungs: CTA B - no wheezing  Cardiovascular: RRR - no M or rub  Abdomen: NT/ND, soft, bs+, no mass, no rebound  Extremities: No signif edema bilateral lower extremities  CBC: Recent Labs  Lab 01/22/18 1444 01/23/18 0308 01/24/18 1046 01/25/18 0356  WBC 8.8 7.8 10.9* 9.7  NEUTROABS 7.4  --   --   --  HGB 12.4 11.5* 11.7* 11.3*  HCT 33.3* 31.8* 32.1* 32.1*  MCV 87.6 89.6 91.2 93.0  PLT 251 227 225 341   Basic Metabolic Panel: Recent Labs  Lab 01/22/18 1229  01/25/18 0356 01/25/18 1442 01/26/18 0656  NA 109*   < > 124* 121* 129*  K 3.1*   < > 4.4 3.9 4.2  CL 68*   < > 91* 86* 91*  CO2 27   < > 28 24 28   GLUCOSE 104*   < > 96 169* 96  BUN 12   < > 8 10 9   CREATININE 0.56   < > 0.44 0.48 0.40*  CALCIUM 8.7*   < > 8.4* 8.7* 8.8*  MG 1.6*  --   --   --  1.7  PHOS  --   --   --   --  3.2   < > = values in this interval not displayed.   GFR: Estimated Creatinine Clearance: 30.1  mL/min (A) (by C-G formula based on SCr of 0.4 mg/dL (L)).  Liver Function Tests: Recent Labs  Lab 01/22/18 1229 01/24/18 1046 01/25/18 0356  AST 54* 30 25  ALT 35 27 26  ALKPHOS 62 59 49  BILITOT 1.3* 0.6 0.6  PROT 6.6 5.3* 5.2*  ALBUMIN 3.8 3.1* 2.9*    Cardiac Enzymes: Recent Labs  Lab 01/22/18 1854 01/22/18 2202  TROPONINI <0.03 <0.03    HbA1C: Hgb A1c MFr Bld  Date/Time Value Ref Range Status  01/22/2018 06:54 PM 5.1 4.8 - 5.6 % Final    Comment:    (NOTE) Pre diabetes:          5.7%-6.4% Diabetes:              >6.4% Glycemic control for   <7.0% adults with diabetes     CBG: Recent Labs  Lab 01/22/18 2027 01/22/18 2145  GLUCAP 104* 100*    Recent Results (from the past 240 hour(s))  MRSA PCR Screening     Status: None   Collection Time: 01/22/18  9:52 PM  Result Value Ref Range Status   MRSA by PCR NEGATIVE NEGATIVE Final    Comment:        The GeneXpert MRSA Assay (FDA approved for NASAL specimens only), is one component of a comprehensive MRSA colonization surveillance program. It is not intended to diagnose MRSA infection nor to guide or monitor treatment for MRSA infections. Performed at [facility], [place] [address], [place], [place] [address]      Scheduled Meds: . enoxaparin (LOVENOX) injection  20 mg Subcutaneous Q24H  . lactose free nutrition  237 mL Oral TID WC  . mirtazapine  15 mg Oral QHS   Continuous Infusions: . sodium chloride 10 mL/hr at 01/26/18 0400     LOS: 4 days   Cherene Altes, MD [facility] Office  [phone] Pager - Text Page per Shea Evans  If 7PM-7AM, please contact night-coverage per Amion 01/26/2018, 3:21 PM

## 2018-01-27 LAB — BASIC METABOLIC PANEL
Anion gap: 7 (ref 5–15)
BUN: 9 mg/dL (ref 8–23)
CO2: 28 mmol/L (ref 22–32)
CREATININE: 0.48 mg/dL (ref 0.44–1.00)
Calcium: 8.7 mg/dL — ABNORMAL LOW (ref 8.9–10.3)
Chloride: 95 mmol/L — ABNORMAL LOW (ref 98–111)
GFR calc Af Amer: >60 mL/min (ref >60)
GLUCOSE: 98 mg/dL (ref 70–99)
Potassium: 4 mmol/L (ref 3.5–5.1)
SODIUM: 130 mmol/L — AB (ref 135–145)

## 2018-01-27 MED ORDER — MIRTAZAPINE 7.5 MG PO TABS
15.0000 mg | ORAL_TABLET | Freq: Every day | ORAL | Status: DC
  Administered 2018-01-27 – 2018-01-28 (×2): 15 mg via ORAL
  Filled 2018-01-27 (×2): qty 2

## 2018-01-27 MED ORDER — ARTIFICIAL TEARS OPHTHALMIC OINT
TOPICAL_OINTMENT | Freq: Three times a day (TID) | OPHTHALMIC | Status: DC
  Administered 2018-01-27 – 2018-01-28 (×2): via OPHTHALMIC
  Filled 2018-01-27: qty 3.5

## 2018-01-27 NOTE — Progress Notes (Addendum)
2:48 pm [facility] has offered bed for patient. CSW confirmed with daughter that they would like to use this facility. [facility] to start [place] today; awaiting determination.  12:20 pm CSW met with patient and daughter, Ginger, at bedside. Patient alert, eating lunch. CSW discussed disposition planning, as RNCM indicated patient's family prefers SNF over home health; patient does not have 24 hour assistance at home. Ginger stated she prefers [facility] or [facility].   Patient will require [facility] authorization before admitting to a facility.  CSW sent out initial referrals, awaiting bed offers. Will provide offers when available. CSW to follow and support with disposition planning.  Estanislado Emms, [place]

## 2018-01-27 NOTE — Progress Notes (Signed)
[place] TEAM 1 - Stepdown/ICU TEAM  Julie [name]  UQJ:[id number] DOB: 17-Jun-1930 DOA: 01/22/2018 PCP: Hayden Rasmussen, MD    Brief Narrative:  84yo F w/ a Hx of HTN who presented w/ AMS and generalized weakness and was found to have a Na of 108.  Head CT showed no acute disease.    Subjective: Looks great.  No complaints.  Alert and conversant.  Foley cath is out and pt tells me she is urinating w/o trouble.  Denies cp, sob, n/v.    Assessment & Plan:  Severe hyponatremia Due to combination of diureitc +/- SIADH - free water restriction continues - Na appears to be stabilizing/improving - cont to follow daily - resumed remeron w/ no deleterious effect thus far - will NOT resume diuretic or ACEi - if Na remains stable/or increased in AM pt should be medically clear for d/c   Recent Labs  Lab 01/23/18 1914 01/24/18 1046 01/25/18 0356 01/25/18 1442 01/26/18 0656 01/27/18 0740  NA 123* 118* 124* 121* 129* 130*    Acute encephalopathy CT head unrevealing - B12 and folate are not low - mental status has returned to normal w/ improving Na  Hypokalemia  Corrected   Hypomagnesemia Modestly low - supplement and follow - likely due to use of diuretic   Hypertension avoid overaggressive control in this elderly patient - BP presently controlled   Transient newly appreciated Afib  Noted at time of admit - spontaneously converted back to NSR during course of hospital stay - suspect this was incited by extreme electrolyte abnormalities - will not initiate anticoag at this time - cont to monitor on tele   Generalized anxiety d/o  Resumed nightly remeron and prn daytime klonopin - if Na drops again will have to consider stopping remeron again, but thus far Na continues to climb  Urinary retention  Noted 01/23/18 - likely due to severe hyponatremia - foley removed 9/15 and pt urinating w/o trouble   Normocytic anemia  Most likely due to severe malnutrition - no Fe deficiency on labs  - most c/w ACD   Breast mass Family and patient have declined further evaluation   L Leg pain B LE dopplers 9/13 w/o evidence of DVT - pain resolved   Severe malnutrition - Underweight Encouraged improved oral intake   DVT prophylaxis: lovenox  Code Status: DNR - NO CODE Family Communication:no family present in room  Disposition Plan: anticipate pt will be ready for d/c to SNF as soon as 9/17 AM if Na continues to climb   Consultants:  PCCM  Antimicrobials:  none   Objective: Blood pressure 120/67, pulse 81, temperature 97.7 F (36.5 C), temperature source Oral, resp. rate 18, height 5\' 3"  (1.6 m), weight 40.2 kg, SpO2 96 %.  Intake/Output Summary (Last 24 hours) at 01/27/2018 1415 Last data filed at 01/27/2018 0900 Gross per 24 hour  Intake 290 ml  Output 700 ml  Net -410 ml   Filed Weights   01/22/18 2245 01/26/18 0553 01/27/18 0426  Weight: 41 kg 37.1 kg 40.2 kg    Examination: General: No acute respiratory distress - A&Ox4 Lungs: CTA B w/o wheezing or crackles  Cardiovascular: RRR  Abdomen: NT/ND, soft, bs+, no mass, no rebound  Extremities: No signif edema B LE   CBC: Recent Labs  Lab 01/22/18 1444 01/23/18 0308 01/24/18 1046 01/25/18 0356  WBC 8.8 7.8 10.9* 9.7  NEUTROABS 7.4  --   --   --   HGB 12.4 11.5* 11.7*  11.3*  HCT 33.3* 31.8* 32.1* 32.1*  MCV 87.6 89.6 91.2 93.0  PLT 251 227 225 160   Basic Metabolic Panel: Recent Labs  Lab 01/22/18 1229  01/25/18 1442 01/26/18 0656 01/27/18 0740  NA 109*   < > 121* 129* 130*  K 3.1*   < > 3.9 4.2 4.0  CL 68*   < > 86* 91* 95*  CO2 27   < > 24 28 28   GLUCOSE 104*   < > 169* 96 98  BUN 12   < > 10 9 9   CREATININE 0.56   < > 0.48 0.40* 0.48  CALCIUM 8.7*   < > 8.7* 8.8* 8.7*  MG 1.6*  --   --  1.7  --   PHOS  --   --   --  3.2  --    < > = values in this interval not displayed.   GFR: Estimated Creatinine Clearance: 32.6 mL/min (by C-G formula based on SCr of 0.48 mg/dL).  Liver Function  Tests: Recent Labs  Lab 01/22/18 1229 01/24/18 1046 01/25/18 0356  AST 54* 30 25  ALT 35 27 26  ALKPHOS 62 59 49  BILITOT 1.3* 0.6 0.6  PROT 6.6 5.3* 5.2*  ALBUMIN 3.8 3.1* 2.9*    Cardiac Enzymes: Recent Labs  Lab 01/22/18 1854 01/22/18 2202  TROPONINI <0.03 <0.03    HbA1C: Hgb A1c MFr Bld  Date/Time Value Ref Range Status  01/22/2018 06:54 PM 5.1 4.8 - 5.6 % Final    Comment:    (NOTE) Pre diabetes:          5.7%-6.4% Diabetes:              >6.4% Glycemic control for   <7.0% adults with diabetes     CBG: Recent Labs  Lab 01/22/18 2027 01/22/18 2145  GLUCAP 104* 100*    Recent Results (from the past 240 hour(s))  MRSA PCR Screening     Status: None   Collection Time: 01/22/18  9:52 PM  Result Value Ref Range Status   MRSA by PCR NEGATIVE NEGATIVE Final    Comment:        The GeneXpert MRSA Assay (FDA approved for NASAL specimens only), is one component of a comprehensive MRSA colonization surveillance program. It is not intended to diagnose MRSA infection nor to guide or monitor treatment for MRSA infections. Performed at [facility], [place] [address] [place], [place] [address]      Scheduled Meds: . enoxaparin (LOVENOX) injection  20 mg Subcutaneous Q24H  . lactose free nutrition  237 mL Oral TID WC  . mirtazapine  15 mg Oral q1800      LOS: 5 days   Cherene Altes, MD [facility] Office  [phone] Pager - Text Page per Shea Evans  If 7PM-7AM, please contact night-coverage per Amion 01/27/2018, 2:15 PM

## 2018-01-27 NOTE — [place] FL2 (Signed)
  [place] MEDICAID FL2 LEVEL OF CARE SCREENING TOOL     IDENTIFICATION  Patient Name: Julie [name] Birthdate: 05-11-1931 Sex: female Admission Date (Current Location): 01/22/2018  [facility] and [place] Number:  Herbalist and Address:  [facility] [place], [place] [address] [place], [place] [address]      Provider Number: [id number]  Attending Physician Name and Address:  Cherene Altes, MD  Relative Name and Phone Number:  Airel Magadan, daughter, [phone]    Current Level of Care: Hospital Recommended Level of Care: [place] Prior Approval Number:    Date Approved/Denied:   PASRR Number: [id number]  Discharge Plan: SNF    Current Diagnoses: Patient Active Problem List   Diagnosis Date Noted  . Protein-calorie malnutrition, severe 01/23/2018  . Anxiety 01/22/2018  . Mass of breast, left 01/22/2018  . Hyponatremia 01/22/2018  . Cognitive decline 01/22/2018  . Opacity of lung on imaging study 01/22/2018  . Anorexia 01/22/2018  . Weight loss 01/22/2018  . Hypokalemia 01/22/2018  . HTN (hypertension) 07/19/2011    Orientation RESPIRATION BLADDER Height & Weight     Self, Place  Normal Continent Weight: 88 lb 9.6 oz (40.2 kg) Height:  5\' 3"  (160 cm)  BEHAVIORAL SYMPTOMS/MOOD NEUROLOGICAL BOWEL NUTRITION STATUS      Continent Diet(please see DC summary)  AMBULATORY STATUS COMMUNICATION OF NEEDS Skin   Limited Assist Verbally Normal                       Personal Care Assistance Level of Assistance  Bathing, Feeding, Dressing Bathing Assistance: Limited assistance Feeding assistance: Independent Dressing Assistance: Limited assistance     Functional Limitations Info  Sight, Hearing, Speech Sight Info: Adequate Hearing Info: Adequate Speech Info: Adequate    SPECIAL CARE FACTORS FREQUENCY  PT (By licensed PT), OT (By licensed OT)     PT Frequency: 5x/week OT Frequency: 5x/week             Contractures Contractures Info: Not present    Additional Factors Info  Code Status, Allergies, Psychotropic Code Status Info: DNR Allergies Info: Nitrofurantoin Monohyd Macro Psychotropic Info: remeron         Current Medications (01/27/2018):  This is the current hospital active medication list Current Facility-Administered Medications  Medication Dose Route Frequency Provider Last Rate Last Dose  . acetaminophen (TYLENOL) tablet 650 mg  650 mg Oral Q6H PRN Cherene Altes, MD      . clonazepam Bobbye [place]) disintegrating tablet 0.25 mg  0.25 mg Oral TID PRN Cherene Altes, MD   0.25 mg at 01/26/18 1857  . enoxaparin (LOVENOX) injection 20 mg  20 mg Subcutaneous Q24H Simonne Maffucci B, MD   20 mg at 01/25/18 1719  . lactose free nutrition (BOOST PLUS) liquid 237 mL  237 mL Oral TID WC Simonne Maffucci B, MD   237 mL at 01/27/18 1207  . mirtazapine (REMERON) tablet 15 mg  15 mg Oral QHS Cherene Altes, MD   15 mg at 01/26/18 2011     Discharge Medications: Please see discharge summary for a list of discharge medications.  Relevant Imaging Results:  Relevant Lab Results:   Additional Information SSN: [id number]  Estanislado Emms, LCSW

## 2018-01-27 NOTE — Progress Notes (Signed)
Physical Therapy Treatment Patient Details Name: Julie [name] MRN: [id number] DOB: 01/07/1931 Today's Date: 01/27/2018    History of Present Illness 84 y.o. female with medical history significant for hypertension, anxiety who presented to the ED today complaining of generalized weakness.  Found to have severe hyponatremia in setting of HCTZ use and apparent euvolemia, also with mass on L breast.    PT Comments    Pt making steady progress with mobility. Pt lives alone and currently doesn't have the strength and activity tolerance to manage on her own at home. Recommend ST-SNF and pt and daughter are in agreement.    Follow Up Recommendations  SNF     Equipment Recommendations  3in1 (PT);Other (comment)(rollator)    Recommendations for Other Services       Precautions / Restrictions Precautions Precautions: Fall Restrictions Weight Bearing Restrictions: No    Mobility  Bed Mobility               General bed mobility comments: Pt up on commode in bathroom  Transfers Overall transfer level: Needs assistance Equipment used: Rolling walker (2 wheeled);None Transfers: Sit to/from Stand Sit to Stand: Mod assist;Min assist         General transfer comment: Assist to bring hips and trunk up. Pt needed mod assist from commode and min assist from recliner.  Ambulation/Gait Ambulation/Gait assistance: Min guard Gait Distance (Feet): 120 Feet Assistive device: Rolling walker (2 wheeled);4-wheeled walker Gait Pattern/deviations: Step-through pattern;Trunk flexed;Decreased stride length Gait velocity: decreased Gait velocity interpretation: <1.31 ft/sec, indicative of household ambulator General Gait Details: Assist for safety. Pt with fatigue with longer ambulation.   Stairs             Wheelchair Mobility    Modified Rankin (Stroke Patients Only)       Balance Overall balance assessment: Needs assistance Sitting-balance support: No upper extremity  supported;Feet supported Sitting balance-Leahy Scale: Good     Standing balance support: Single extremity supported Standing balance-Leahy Scale: Poor Standing balance comment: UE support for static standing                            Cognition Arousal/Alertness: Awake/alert Behavior During Therapy: WFL for tasks assessed/performed Overall Cognitive Status: Within Functional Limits for tasks assessed                                        Exercises      General Comments        Pertinent Vitals/Pain      Home Living                      Prior Function            PT Goals (current goals can now be found in the care plan section) Progress towards PT goals: Progressing toward goals    Frequency    Min 2X/week      PT Plan Discharge plan needs to be updated;Frequency needs to be updated    Co-evaluation              AM-PAC PT "6 Clicks" Daily Activity  Outcome Measure  Difficulty turning over in bed (including adjusting bedclothes, sheets and blankets)?: None Difficulty moving from lying on back to sitting on the side of the bed? : A Little Difficulty sitting down on and standing  up from a chair with arms (e.g., wheelchair, bedside commode, etc,.)?: Unable Help needed moving to and from a bed to chair (including a wheelchair)?: A Little Help needed walking in hospital room?: A Little Help needed climbing 3-5 steps with a railing? : A Lot 6 Click Score: 16    End of Session Equipment Utilized During Treatment: Gait belt Activity Tolerance: Patient tolerated treatment well Patient left: in chair;with call bell/phone within reach Nurse Communication: Mobility status PT Visit Diagnosis: Muscle weakness (generalized) (M62.81);Difficulty in walking, not elsewhere classified (R26.2)     Time: 2244-9753 PT Time Calculation (min) (ACUTE ONLY): 17 min  Charges:  $Gait Training: 8-22 mins                     [place] Pager [phone] Office [place] 01/27/2018, 2:23 PM

## 2018-01-28 LAB — BASIC METABOLIC PANEL
ANION GAP: 8 (ref 5–15)
BUN: 12 mg/dL (ref 8–23)
CHLORIDE: 94 mmol/L — AB (ref 98–111)
CO2: 29 mmol/L (ref 22–32)
Calcium: 8.5 mg/dL — ABNORMAL LOW (ref 8.9–10.3)
Creatinine, Ser: 0.57 mg/dL (ref 0.44–1.00)
GFR calc non Af Amer: >60 mL/min (ref >60)
Glucose, Bld: 88 mg/dL (ref 70–99)
Potassium: 4.1 mmol/L (ref 3.5–5.1)
Sodium: 131 mmol/L — ABNORMAL LOW (ref 135–145)

## 2018-01-28 LAB — MAGNESIUM: Magnesium: 1.9 mg/dL (ref 1.7–2.4)

## 2018-01-28 MED ORDER — ARTIFICIAL TEARS OPHTHALMIC OINT: TOPICAL_OINTMENT | Freq: Three times a day (TID) | OPHTHALMIC | Status: AC

## 2018-01-28 MED ORDER — ACETAMINOPHEN 325 MG PO TABS: 650.0000 mg | ORAL_TABLET | Freq: Four times a day (QID) | ORAL | Status: AC | PRN

## 2018-01-28 NOTE — Discharge Instructions (Signed)
Hyponatremia °Hyponatremia is when the amount of salt (sodium) in your blood is too low. When sodium levels are low, your cells absorb extra water and they swell. The swelling happens throughout the body, but it mostly affects the brain. °What are the causes? °This condition may be caused by: °· Heart, kidney, or liver problems. °· Thyroid problems. °· Adrenal gland problems. °· Metabolic conditions, such as syndrome of inappropriate antidiuretic hormone (SIADH). °· Severe vomiting and diarrhea. °· Certain medicines or illegal drugs. °· Dehydration. °· Drinking too much water. °· Eating a diet that is low in sodium. °· Large burns on your body. °· Sweating. ° °What increases the risk? °This condition is more likely to develop in people who: °· Have long-term (chronic) kidney disease. °· Have heart failure. °· Have a medical condition that causes frequent or excessive diarrhea. °· Have metabolic conditions, such as Addison disease or SIADH. °· Take certain medicines that affect the sodium and fluid balance in the blood. Some of these medicine types include: °? Diuretics. °? NSAIDs. °? Some opioid pain medicines. °? Some antidepressants. °? Some seizure prevention medicines. ° °What are the signs or symptoms? °Symptoms of this condition include: °· Nausea and vomiting. °· Confusion. °· Lethargy. °· Agitation. °· Headache. °· Seizures. °· Unconsciousness. °· Appetite loss. °· Muscle weakness and cramping. °· Feeling weak or light-headed. °· Having a rapid heart rate. °· Fainting, in severe cases. ° °How is this diagnosed? °This condition is diagnosed with a medical history and physical exam. You will also have other tests, including: °· Blood tests. °· Urine tests. ° °How is this treated? °Treatment for this condition depends on the cause. Treatment may include: °· Fluids given through an IV tube that is inserted into one of your veins. °· Medicines to correct the sodium imbalance. If medicines are causing the  condition, the medicines will need to be adjusted. °· Limiting water or fluid intake to get the correct sodium balance. ° °Follow these instructions at home: °· Take medicines only as directed by your health care provider. Many medicines can make this condition worse. Talk with your health care provider about any medicines that you are currently taking. °· Carefully follow a recommended diet as directed by your health care provider. °· Carefully follow instructions from your health care provider about fluid restrictions. °· Keep all follow-up visits as directed by your health care provider. This is important. °· Do not drink alcohol. °Contact a health care provider if: °· You develop worsening nausea, fatigue, headache, confusion, or weakness. °· Your symptoms go away and then return. °· You have problems following the recommended diet. °Get help right away if: °· You have a seizure. °· You faint. °· You have ongoing diarrhea or vomiting. °This information is not intended to replace advice given to you by your health care provider. Make sure you discuss any questions you have with your health care provider. °Document Released: 04/20/2002 Document Revised: 10/06/2015 Document Reviewed: 05/20/2014 °Elsevier Interactive Patient Education © 2018 Elsevier Inc. ° °

## 2018-01-28 NOTE — Progress Notes (Signed)
Spoke to [place] at [facility] and gave report. Will send a AVS and DNR with transport. ASV  Awaiting [facility] transport schedule for 3pm pick up. I will continue to monitor the patient closely.     Saddie Benders RN

## 2018-01-28 NOTE — Progress Notes (Addendum)
Patient will discharge to [facility] Anticipated discharge date: 01/28/18 Family notified: Latanya Maudlin, daughter Transportation by: [facility]  Nurse to call report to [phone]. Patient will go to room 404B at the facility.   CSW signing off.  Estanislado Emms, [place]  Clinical Social Worker

## 2018-01-28 NOTE — Progress Notes (Addendum)
11:03 am SNF has received insurance authorization for patient. SNF will accept patient when medically ready. CSW to follow.  9:51 am CSW awaiting [facility] to receive [facility] authorization for patient, which was started yesterday. CSW to follow.  Estanislado Emms, [place]

## 2018-01-28 NOTE — Care Management Important Message (Signed)
Important Message  Patient Details  Name: Julie [name] MRN: [id number] Date of Birth: Jul 11, 1930   Medicare Important Message Given:  Yes    Luane Rochon P Sarahi Borland 01/28/2018, 3:22 PM

## 2018-01-28 NOTE — Discharge Summary (Addendum)
DISCHARGE SUMMARY  Julie [name]  MR#: [id number]  DOB:06-Sep-1930  Date of Admission: 01/22/2018 Date of Discharge: 01/28/2018  Attending Physician:Julie Duey Hennie Duos, MD  Patient's WCB:JSEGBTD, Julie Munroe, MD  Consults:  PCCM  Disposition: D/C to SNF   Follow-up Appts:  Contact information for follow-up providers    Care, [facility] Follow up.   Specialty:  Home Health Services Why:  Nursing, physical therapy, occupational therapy, speech language pathologist, nurses aid, social work Contact information: [place] [address] [phone]            Contact information for after-discharge care    Destination    [facility] Preferred SNF .   Service:  Skilled Nursing Contact information: [place] [phone]                  Tests Needing Follow-up: -Na, K+, and Mg levels should be checked in 3-5 days -follow mental status for evidence of persistent sundowning which may require pharmacologic tx   Discharge Diagnoses: Severe subacute hyponatremia Acute encephalopathy Sundowning  Hypokalemia  Hypomagnesemia Hypertension Transient newly appreciated Afib  Generalized anxiety d/o  Urinary retention  Normocytic anemia  Breast mass Severe malnutrition - Underweight DNR - NO CODE BLUE  Initial presentation: 84yo F w/ a Hxof HTN who presented w/ AMS and generalized weakness and was found to have a Na of 108. Head CT showed no acute disease.   Hospital Course:  Severe hyponatremia Due to combination of diureitc +/- SIADH - Na slowly and steadily improving at time of d/c - resumed remeron w/ no deleterious effect thus far - do NOT resume diuretic or ACEi   Acute encephalopathy CT head unrevealing - B12 and folate are not low - mental status has returned to normal w/ improving Na - pt was exhibiting some nighttime confusion in the later portion of her  hospital stay, most c/w sundowning - this should be monitored in the SNF setting   Hypokalemia  Corrected   Hypomagnesemia Modestly low - corrected w/ Mg 1.9 at time of d/c   Hypertension avoid overaggressive control in this elderly patient - BP presently controlled   Transient newly appreciated Afib  Noted at time of admit - spontaneously converted back to NSR during course of hospital stay - suspect this was incited by extreme electrolyte abnormalities - did not initiate anticoag at this time   Generalized anxiety d/o  Resumed nightly remeron and prn daytime klonopin - if Na drops again will have to consider stopping remeron, but thus far Na continues to climb  Urinary retention  Noted 01/23/18 - likely due to severe hyponatremia - foley removed 9/15 and pt urinating w/o trouble   Normocytic anemia  Most likely due to severe malnutrition - no Fe deficiency on labs - most c/w ACD   Breast mass Family and patient have declined further evaluation   L Leg pain B LE dopplers 9/13 w/o evidence of DVT - pain resolved   Severe malnutrition - Underweight Encouraged improved oral intake   Allergies as of 01/28/2018      Reactions   Nitrofurantoin Monohyd Macro Nausea Only      Medication List    STOP taking these medications   chlorthalidone 25 MG tablet Commonly known as:  HYGROTON   KLOR-CON M20 20 MEQ tablet Generic drug:  potassium chloride SA   lisinopril 10 MG tablet Commonly known as:  PRINIVIL,ZESTRIL   losartan 25  MG tablet Commonly known as:  COZAAR     TAKE these medications   acetaminophen 325 MG tablet Commonly known as:  TYLENOL Take 2 tablets (650 mg total) by mouth every 6 (six) hours as needed for mild pain, fever or headache.   artificial tears Oint ophthalmic ointment Commonly known as:  LACRILUBE Place into both eyes every 8 (eight) hours.   clonazePAM 0.5 MG tablet Commonly known as:  KLONOPIN Take 0.0125 mg by mouth as needed for  anxiety.   mirtazapine 30 MG tablet Commonly known as:  REMERON Take 15 mg by mouth at bedtime.            Durable Medical Equipment  (From admission, onward)         Start     Ordered   01/22/18 1407  For home use only DME Walker rolling  Once    Question:  Patient needs a walker to treat with the following condition  Answer:  Gait abnormality   01/22/18 1406   01/22/18 1407  For home use only DME 3 n 1  Once     01/22/18 1406          Day of Discharge BP (!) 164/62 (BP Location: Left Arm)   Pulse 75   Temp 97.7 F (36.5 C) (Oral)   Resp 16   Ht 5\' 3"  (1.6 m)   Wt 40.6 kg   SpO2 98%   BMI 15.86 kg/m   Physical Exam: General: No acute respiratory distress Lungs: Clear to auscultation bilaterally without wheezes or crackles Cardiovascular: Regular rate and rhythm without murmur gallop or rub normal S1 and S2 Abdomen: Nontender, nondistended, soft, bowel sounds positive, no rebound, no ascites, no appreciable mass Extremities: No significant cyanosis, clubbing, or edema bilateral lower extremities  Basic Metabolic Panel: Recent Labs  Lab 01/22/18 1229  01/25/18 0356 01/25/18 1442 01/26/18 0656 01/27/18 0740 01/28/18 0413  NA 109*   < > 124* 121* 129* 130* 131*  K 3.1*   < > 4.4 3.9 4.2 4.0 4.1  CL 68*   < > 91* 86* 91* 95* 94*  CO2 27   < > 28 24 28 28 29   GLUCOSE 104*   < > 96 169* 96 98 88  BUN 12   < > 8 10 9 9 12   CREATININE 0.56   < > 0.44 0.48 0.40* 0.48 0.57  CALCIUM 8.7*   < > 8.4* 8.7* 8.8* 8.7* 8.5*  MG 1.6*  --   --   --  1.7  --  1.9  PHOS  --   --   --   --  3.2  --   --    < > = values in this interval not displayed.    Liver Function Tests: Recent Labs  Lab 01/22/18 1229 01/24/18 1046 01/25/18 0356  AST 54* 30 25  ALT 35 27 26  ALKPHOS 62 59 49  BILITOT 1.3* 0.6 0.6  PROT 6.6 5.3* 5.2*  ALBUMIN 3.8 3.1* 2.9*    CBC: Recent Labs  Lab 01/22/18 1444 01/23/18 0308 01/24/18 1046 01/25/18 0356  WBC 8.8 7.8 10.9* 9.7    NEUTROABS 7.4  --   --   --   HGB 12.4 11.5* 11.7* 11.3*  HCT 33.3* 31.8* 32.1* 32.1*  MCV 87.6 89.6 91.2 93.0  PLT 251 227 225 199    Cardiac Enzymes: Recent Labs  Lab 01/22/18 1854 01/22/18 2202  TROPONINI <0.03 <0.03    CBG:  Recent Labs  Lab 01/22/18 2027 01/22/18 2145  GLUCAP 104* 100*    Recent Results (from the past 240 hour(s))  MRSA PCR Screening     Status: None   Collection Time: 01/22/18  9:52 PM  Result Value Ref Range Status   MRSA by PCR NEGATIVE NEGATIVE Final    Comment:        The GeneXpert MRSA Assay (FDA approved for NASAL specimens only), is one component of a comprehensive MRSA colonization surveillance program. It is not intended to diagnose MRSA infection nor to guide or monitor treatment for MRSA infections. Performed at [facility], [facility] [address], [place], [place] [address]      Time spent in discharge (includes decision making & examination of pt): 35 minutes  01/28/2018, 2:02 PM   Cherene Altes, MD [facility] Office  [phone] Pager [phone]  On-Call/Text Page:      [email or web address]      password [facility]

## 2018-01-28 NOTE — Clinical Social Work Placement (Signed)
   CLINICAL SOCIAL WORK PLACEMENT  NOTE  Date:  01/28/2018  Patient Details  Name: Julie [name] MRN: [id number] Date of Birth: [birth year]  Clinical Social Work is seeking post-discharge placement for this patient at the [place] level of care (*CSW will initial, date and re-position this form in  chart as items are completed):  Yes   Patient/family provided with [facility] Work Department's list of facilities offering this level of care within the geographic area requested by the patient (or if unable, by the patient's family).  Yes   Patient/family informed of their freedom to choose among providers that offer the needed level of care, that participate in Medicare, Medicaid or managed care program needed by the patient, have an available bed and are willing to accept the patient.  Yes   Patient/family informed of [facility]'s ownership interest in [facility] and [facility], as well as of the fact that they are under no obligation to receive care at these facilities.  PASRR submitted to EDS on 01/27/18     PASRR number received on 01/27/18     Existing PASRR number confirmed on       FL2 transmitted to all facilities in geographic area requested by pt/family on 01/27/18     FL2 transmitted to all facilities within larger geographic area on       Patient informed that his/her managed care company has contracts with or will negotiate with certain facilities, including the following:  [facility]         Patient/family informed of bed offers received.  Patient chooses bed at [place], [place]     Physician recommends and patient chooses bed at      Patient to be transferred to [facility] on 01/28/18.  Patient to be transferred to facility by [facility]     Patient family notified on 01/28/18 of transfer.  Name of family member notified:  Ginger, daughter     PHYSICIAN Please prepare priority discharge  summary, including medications, Please prepare prescriptions, Please sign DNR     Additional Comment:    _______________________________________________ Estanislado Emms, LCSW 01/28/2018, 1:52 PM

## 2018-02-02 ENCOUNTER — Emergency Department (HOSPITAL_COMMUNITY)
Admission: EM | Admit: 2018-02-02 | Discharge: 2018-02-03 | Disposition: A | Discharge status: Home / Self Care | Payer: Medicare Other | Attending: Emergency Medicine | Admitting: Emergency Medicine

## 2018-02-02 ENCOUNTER — Emergency Department (HOSPITAL_COMMUNITY): Payer: Medicare Other

## 2018-02-02 ENCOUNTER — Encounter (HOSPITAL_COMMUNITY): Payer: Self-pay

## 2018-02-02 DIAGNOSIS — Y999 Unspecified external cause status: Secondary | ICD-10-CM | POA: Insufficient documentation

## 2018-02-02 DIAGNOSIS — S52502A Unspecified fracture of the lower end of left radius, initial encounter for closed fracture: Secondary | ICD-10-CM | POA: Insufficient documentation

## 2018-02-02 DIAGNOSIS — W2203XA Walked into furniture, initial encounter: Secondary | ICD-10-CM | POA: Insufficient documentation

## 2018-02-02 DIAGNOSIS — Y929 Unspecified place or not applicable: Secondary | ICD-10-CM | POA: Diagnosis not present

## 2018-02-02 DIAGNOSIS — I10 Essential (primary) hypertension: Secondary | ICD-10-CM | POA: Insufficient documentation

## 2018-02-02 DIAGNOSIS — Z23 Encounter for immunization: Secondary | ICD-10-CM | POA: Diagnosis not present

## 2018-02-02 DIAGNOSIS — S0101XA Laceration without foreign body of scalp, initial encounter: Secondary | ICD-10-CM | POA: Diagnosis not present

## 2018-02-02 DIAGNOSIS — S0990XA Unspecified injury of head, initial encounter: Secondary | ICD-10-CM | POA: Diagnosis present

## 2018-02-02 DIAGNOSIS — W19XXXA Unspecified fall, initial encounter: Secondary | ICD-10-CM

## 2018-02-02 DIAGNOSIS — Y939 Activity, unspecified: Secondary | ICD-10-CM | POA: Diagnosis not present

## 2018-02-02 LAB — BASIC METABOLIC PANEL
Anion gap: 11 (ref 5–15)
BUN: 12 mg/dL (ref 8–23)
CALCIUM: 8.7 mg/dL — AB (ref 8.9–10.3)
CO2: 25 mmol/L (ref 22–32)
CREATININE: 0.52 mg/dL (ref 0.44–1.00)
Chloride: 95 mmol/L — ABNORMAL LOW (ref 98–111)
GFR calc Af Amer: >60 mL/min (ref >60)
GFR calc non Af Amer: >60 mL/min (ref >60)
GLUCOSE: 118 mg/dL — AB (ref 70–99)
Potassium: 3.2 mmol/L — ABNORMAL LOW (ref 3.5–5.1)
Sodium: 131 mmol/L — ABNORMAL LOW (ref 135–145)

## 2018-02-02 MED ORDER — TETANUS-DIPHTH-ACELL PERTUSSIS 5-2.5-18.5 LF-MCG/0.5 IM SUSP
0.5000 mL | Freq: Once | INTRAMUSCULAR | Status: AC
  Administered 2018-02-02: 0.5 mL via INTRAMUSCULAR
  Filled 2018-02-02: qty 0.5

## 2018-02-02 MED ORDER — HYDROCODONE-ACETAMINOPHEN 5-325 MG PO TABS
1.0000 | ORAL_TABLET | Freq: Once | ORAL | Status: AC
  Administered 2018-02-02: 1 via ORAL
  Filled 2018-02-02: qty 1

## 2018-02-02 MED ORDER — LIDOCAINE-EPINEPHRINE (PF) 2 %-1:200000 IJ SOLN
20.0000 mL | Freq: Once | INTRAMUSCULAR | Status: AC
  Administered 2018-02-02: 20 mL via INTRADERMAL
  Filled 2018-02-02: qty 20

## 2018-02-02 NOTE — ED Triage Notes (Signed)
Pt arrived from [facility] via [facility] after a fall, 2" forehead lac, skin tear to left AC and left knee.  Pt is alert.  DNR at bedside.

## 2018-02-02 NOTE — ED Provider Notes (Signed)
[facility] Emergency Department Provider Note MRN:  [id number]  Arrival date & time: 02/02/18     Chief Complaint   Fall   History of Present Illness   Julie [name] is a 84 y.o. year-old female with a history of hypertension presenting to the ED with chief complaint of fall.  Patient had a recent admission to the hospital for hyponatremia, was discharged 1 to 2 weeks ago.  Has been doing very well.  Explains that she walked into a open door, cutting her forehead, causing her to fall to the ground.  Denies loss of consciousness, no anticoagulation use since her admission.  Endorsing left wrist pain, left elbow pain with skin tear, left knee pain.  No chest pain or shortness of breath, no abdominal pain.  Pain is constant, mild, no exacerbating or alleviating factors.  Review of Systems  A complete 10 system review of systems was obtained and all systems are negative except as noted in the HPI and PMH.   Patient's Health History    Past Medical History:  Diagnosis Date  . Anxiety   . Cancer ([place])   . Hypertension     History reviewed. No pertinent surgical history.  Family History  Problem Relation Age of Onset  . Breast cancer Neg Hx     Social History   Socioeconomic History  . Marital status: Divorced    Spouse name: Not on file  . Number of children: Not on file  . Years of education: Not on file  . Highest education level: Not on file  Occupational History  . Not on file  Social Needs  . Financial resource strain: Not on file  . Food insecurity:    Worry: Not on file    Inability: Not on file  . Transportation needs:    Medical: Not on file    Non-medical: Not on file  Tobacco Use  . Smoking status: Never Smoker  . Smokeless tobacco: Never Used  Substance and Sexual Activity  . Alcohol use: Never    Frequency: Never  . Drug use: Never  . Sexual activity: Not Currently  Lifestyle  . Physical activity:    Days per week: Not on file   Minutes per session: Not on file  . Stress: Not on file  Relationships  . Social connections:    Talks on phone: Not on file    Gets together: Not on file    Attends religious service: Not on file    Active member of club or organization: Not on file    Attends meetings of clubs or organizations: Not on file    Relationship status: Not on file  . Intimate partner violence:    Fear of current or ex partner: Not on file    Emotionally abused: Not on file    Physically abused: Not on file    Forced sexual activity: Not on file  Other Topics Concern  . Not on file  Social History Narrative  . Not on file     Physical Exam  Vital Signs and Nursing Notes reviewed Vitals:   02/02/18 1830 02/02/18 1839  BP: (!) 185/92 (!) 185/92  Pulse: 85 82  Resp:  16  Temp:  98.8 F (37.1 C)  SpO2:  97%    CONSTITUTIONAL: Well-appearing, NAD NEURO:  Alert and oriented x 3, no focal deficits EYES:  eyes equal and reactive ENT/NECK:  no LAD, no JVD CARDIO: Regular rate, well-perfused, normal S1 and S2  PULM:  CTAB no wheezing or rhonchi GI/GU:  normal bowel sounds, non-distended, non-tender MSK/SPINE:  No gross deformities, no edema SKIN:  no rash, skin tear to left elbow with Steri-Strips in place, skin tear to left knee with Steri-Strips in place, 3 cm laceration to the mid forehead PSYCH:  Appropriate speech and behavior  Diagnostic and Interventional Summary    EKG Interpretation  Date/Time:    Ventricular Rate:    PR Interval:    QRS Duration:   QT Interval:    QTC Calculation:   R Axis:     Text Interpretation:        Labs Reviewed  BASIC METABOLIC PANEL - Abnormal; Notable for the following components:      Result Value   Sodium 131 (*)    Potassium 3.2 (*)    Chloride 95 (*)    Glucose, Bld 118 (*)    Calcium 8.7 (*)    All other components within normal limits    CT Head Wo Contrast  Final Result    CT Cervical Spine Wo Contrast  Final Result    DG Wrist  Complete Left  Final Result    DG Elbow 2 Views Left  Final Result    DG Knee Complete 4 Views Left  Final Result      Medications  Tdap (BOOSTRIX) injection 0.5 mL (0.5 mLs Intramuscular Given 02/02/18 1955)  HYDROcodone-acetaminophen (NORCO/VICODIN) 5-325 MG per tablet 1 tablet (1 tablet Oral Given 02/02/18 1955)  lidocaine-EPINEPHrine (XYLOCAINE W/EPI) 2 %-1:200000 (PF) injection 20 mL (20 mLs Intradermal Given 02/02/18 1957)     Procedures Critical Care  ED Course and Medical Decision Making  I have reviewed the triage vital signs and the nursing notes.  Pertinent labs & imaging results that were available during my care of the patient were reviewed by me and considered in my medical decision making (see below for details).  Mechanical ground-level fall in this 84 year old female, forehead laceration, mild to moderate neck pain.  CTs, x-rays, reassess.  CT head and C-spine unremarkable.  Sodium rechecked and on concerning.  New distal radius fracture on the left.  Splinted here in the ED.  We will follow-up with orthopedics.  After the discussed management above, the patient was determined to be safe for discharge.  The patient was in agreement with this plan and all questions regarding their care were answered.  ED return precautions were discussed and the patient will return to the ED with any significant worsening of condition.  Julie [name], [place] [email or web address]  Final Clinical Impressions(s) / ED Diagnoses     ICD-10-CM   1. Fall, initial encounter W19.XXXA   2. Laceration of scalp, initial encounter S01.01XA   3. Closed fracture of distal end of left radius, unspecified fracture morphology, initial encounter S52.502A     ED Discharge Orders    None         Maudie Flakes, MD 02/02/18 2118

## 2018-02-02 NOTE — ED Provider Notes (Signed)
..  Laceration Repair Date/Time: 02/02/2018 8:46 PM Performed by: Rodney Booze, PA-C Authorized by: Rodney Booze, PA-C   Consent:    Consent obtained:  Verbal   Consent given by:  Patient   Risks discussed:  Infection, pain and poor cosmetic result Anesthesia (see MAR for exact dosages):    Anesthesia method:  Local infiltration   Local anesthetic:  Lidocaine 2% WITH epi Laceration details:    Location: left forehead.   Length (cm):  4 Pre-procedure details:    Preparation:  Patient was prepped and draped in usual sterile fashion and imaging obtained to evaluate for foreign bodies Exploration:    Hemostasis achieved with:  Epinephrine   Wound exploration: wound explored through full range of motion and entire depth of wound probed and visualized   Treatment:    Area cleansed with:  Betadine, saline and Shur-Clens   Amount of cleaning:  Extensive   Irrigation solution:  Sterile saline   Irrigation volume:  1L   Irrigation method:  Pressure wash   Visualized foreign bodies/material removed: no   Skin repair:    Repair method:  Sutures   Suture size:  5-0   Suture material:  Prolene   Suture technique:  Simple interrupted   Number of sutures:  7 Approximation:    Approximation:  Close Post-procedure details:    Dressing:  Adhesive bandage   Patient tolerance of procedure:  Tolerated well, no immediate complications   Bishop Dublin 02/02/18 2047    Maudie Flakes, MD 02/02/18 2314

## 2018-02-02 NOTE — Progress Notes (Signed)
Orthopedic Tech Progress Note Patient Details:  Julie [name] 01-26-1931 [id number]  Ortho Devices Type of Ortho Device: Arm sling, Sugartong splint Ortho Device/Splint Location: lue Ortho Device/Splint Interventions: Ordered, Application, Adjustment   Post Interventions Patient Tolerated: Well Instructions Provided: Care of device, Adjustment of device   Karolee Stamps 02/02/2018, 9:24 PM

## 2018-02-02 NOTE — Discharge Instructions (Signed)
You were evaluated in the Emergency Department and after careful evaluation, we did not find any emergent condition requiring admission or further testing in the hospital.  Your symptoms today seem to be due to a fracture of the left wrist.  Please follow-up with orthopedic surgery within the next 1 to 2 weeks for reevaluation.  Keep the splint on and keep it dry.  Your sutures will need to be removed in 3 to 5 days by healthcare professional.  Please return to the Emergency Department if you experience any worsening of your condition.  We encourage you to follow up with a primary care provider.  Thank you for allowing [place] to be a part of your care.

## 2018-02-03 NOTE — ED Notes (Signed)
PTAR ETA 20 min

## 2018-03-22 ENCOUNTER — Inpatient Hospital Stay (HOSPITAL_COMMUNITY)
Admission: EM | Admit: 2018-03-22 | Discharge: 2018-03-26 | DRG: 480 | Disposition: A | Discharge status: Skilled Nursing Facility | Payer: Medicare Other | Source: Skilled Nursing Facility | Attending: Internal Medicine | Admitting: Internal Medicine

## 2018-03-22 ENCOUNTER — Emergency Department (HOSPITAL_COMMUNITY): Payer: Medicare Other

## 2018-03-22 ENCOUNTER — Encounter (HOSPITAL_COMMUNITY): Payer: Self-pay | Admitting: Emergency Medicine

## 2018-03-22 ENCOUNTER — Other Ambulatory Visit: Payer: Self-pay

## 2018-03-22 DIAGNOSIS — N6320 Unspecified lump in the left breast, unspecified quadrant: Secondary | ICD-10-CM | POA: Diagnosis present

## 2018-03-22 DIAGNOSIS — Y9301 Activity, walking, marching and hiking: Secondary | ICD-10-CM | POA: Diagnosis present

## 2018-03-22 DIAGNOSIS — W010XXA Fall on same level from slipping, tripping and stumbling without subsequent striking against object, initial encounter: Secondary | ICD-10-CM | POA: Diagnosis present

## 2018-03-22 DIAGNOSIS — Z419 Encounter for procedure for purposes other than remedying health state, unspecified: Secondary | ICD-10-CM

## 2018-03-22 DIAGNOSIS — J189 Pneumonia, unspecified organism: Secondary | ICD-10-CM | POA: Diagnosis present

## 2018-03-22 DIAGNOSIS — I10 Essential (primary) hypertension: Secondary | ICD-10-CM | POA: Diagnosis present

## 2018-03-22 DIAGNOSIS — E876 Hypokalemia: Secondary | ICD-10-CM | POA: Diagnosis present

## 2018-03-22 DIAGNOSIS — Z79899 Other long term (current) drug therapy: Secondary | ICD-10-CM | POA: Diagnosis not present

## 2018-03-22 DIAGNOSIS — I480 Paroxysmal atrial fibrillation: Secondary | ICD-10-CM | POA: Diagnosis present

## 2018-03-22 DIAGNOSIS — D62 Acute posthemorrhagic anemia: Secondary | ICD-10-CM | POA: Diagnosis not present

## 2018-03-22 DIAGNOSIS — R636 Underweight: Secondary | ICD-10-CM | POA: Diagnosis present

## 2018-03-22 DIAGNOSIS — R4189 Other symptoms and signs involving cognitive functions and awareness: Secondary | ICD-10-CM | POA: Diagnosis not present

## 2018-03-22 DIAGNOSIS — K5900 Constipation, unspecified: Secondary | ICD-10-CM | POA: Diagnosis present

## 2018-03-22 DIAGNOSIS — S72002A Fracture of unspecified part of neck of left femur, initial encounter for closed fracture: Secondary | ICD-10-CM | POA: Diagnosis not present

## 2018-03-22 DIAGNOSIS — Z79891 Long term (current) use of opiate analgesic: Secondary | ICD-10-CM | POA: Diagnosis not present

## 2018-03-22 DIAGNOSIS — F419 Anxiety disorder, unspecified: Secondary | ICD-10-CM | POA: Diagnosis present

## 2018-03-22 DIAGNOSIS — R079 Chest pain, unspecified: Secondary | ICD-10-CM | POA: Diagnosis present

## 2018-03-22 DIAGNOSIS — S72142A Displaced intertrochanteric fracture of left femur, initial encounter for closed fracture: Principal | ICD-10-CM | POA: Diagnosis present

## 2018-03-22 DIAGNOSIS — R21 Rash and other nonspecific skin eruption: Secondary | ICD-10-CM | POA: Diagnosis present

## 2018-03-22 DIAGNOSIS — Z66 Do not resuscitate: Secondary | ICD-10-CM | POA: Diagnosis present

## 2018-03-22 DIAGNOSIS — Z888 Allergy status to other drugs, medicaments and biological substances status: Secondary | ICD-10-CM | POA: Diagnosis not present

## 2018-03-22 DIAGNOSIS — D696 Thrombocytopenia, unspecified: Secondary | ICD-10-CM | POA: Diagnosis not present

## 2018-03-22 DIAGNOSIS — Z681 Body mass index (BMI) 19 or less, adult: Secondary | ICD-10-CM

## 2018-03-22 DIAGNOSIS — G9341 Metabolic encephalopathy: Secondary | ICD-10-CM | POA: Diagnosis not present

## 2018-03-22 DIAGNOSIS — R140 Abdominal distension (gaseous): Secondary | ICD-10-CM

## 2018-03-22 DIAGNOSIS — S72009A Fracture of unspecified part of neck of unspecified femur, initial encounter for closed fracture: Secondary | ICD-10-CM | POA: Diagnosis present

## 2018-03-22 LAB — BASIC METABOLIC PANEL
Anion gap: 9 (ref 5–15)
BUN: 13 mg/dL (ref 8–23)
CO2: 26 mmol/L (ref 22–32)
CREATININE: 0.45 mg/dL (ref 0.44–1.00)
Calcium: 8.8 mg/dL — ABNORMAL LOW (ref 8.9–10.3)
Chloride: 100 mmol/L (ref 98–111)
GFR calc Af Amer: >60 mL/min (ref >60)
GFR calc non Af Amer: >60 mL/min (ref >60)
GLUCOSE: 125 mg/dL — AB (ref 70–99)
Potassium: 3.2 mmol/L — ABNORMAL LOW (ref 3.5–5.1)
SODIUM: 135 mmol/L (ref 135–145)

## 2018-03-22 LAB — CBC WITH DIFFERENTIAL/PLATELET
ABS IMMATURE GRANULOCYTES: 0.04 10*3/uL (ref 0.00–0.07)
Basophils Absolute: 0 10*3/uL (ref 0.0–0.1)
Basophils Relative: 0 %
EOS PCT: 1 %
Eosinophils Absolute: 0.1 10*3/uL (ref 0.0–0.5)
HEMATOCRIT: 38.5 % (ref 36.0–46.0)
Hemoglobin: 12.7 g/dL (ref 12.0–15.0)
IMMATURE GRANULOCYTES: 1 %
LYMPHS ABS: 0.8 10*3/uL (ref 0.7–4.0)
Lymphocytes Relative: 11 %
MCH: 32.8 pg (ref 26.0–34.0)
MCHC: 33 g/dL (ref 30.0–36.0)
MCV: 99.5 fL (ref 80.0–100.0)
MONO ABS: 0.5 10*3/uL (ref 0.1–1.0)
MONOS PCT: 7 %
Neutro Abs: 5.5 10*3/uL (ref 1.7–7.7)
Neutrophils Relative %: 80 %
Platelets: 187 10*3/uL (ref 150–400)
RBC: 3.87 MIL/uL (ref 3.87–5.11)
RDW: 12.8 % (ref 11.5–15.5)
WBC: 6.9 10*3/uL (ref 4.0–10.5)
nRBC: 0 % (ref 0.0–0.2)

## 2018-03-22 LAB — PROTIME-INR
INR: 1
Prothrombin Time: 13.1 seconds (ref 11.4–15.2)

## 2018-03-22 MED ORDER — POTASSIUM CHLORIDE CRYS ER 20 MEQ PO TBCR
20.0000 meq | EXTENDED_RELEASE_TABLET | Freq: Once | ORAL | Status: AC
  Administered 2018-03-23: 20 meq via ORAL
  Filled 2018-03-22: qty 1

## 2018-03-22 MED ORDER — ONDANSETRON HCL 4 MG/2ML IJ SOLN
4.0000 mg | Freq: Once | INTRAMUSCULAR | Status: AC
  Administered 2018-03-22: 4 mg via INTRAVENOUS
  Filled 2018-03-22: qty 2

## 2018-03-22 MED ORDER — LORAZEPAM 0.5 MG PO TABS: 0.5000 mg | ORAL_TABLET | Freq: Every day | ORAL | Status: DC

## 2018-03-22 MED ORDER — CEFAZOLIN SODIUM-DEXTROSE 2-4 GM/100ML-% IV SOLN
2.0000 g | INTRAVENOUS | Status: AC
  Administered 2018-03-23: 2 g via INTRAVENOUS
  Filled 2018-03-22: qty 100

## 2018-03-22 MED ORDER — CHLORHEXIDINE GLUCONATE 4 % EX LIQD
60.0000 mL | Freq: Once | CUTANEOUS | Status: AC
  Administered 2018-03-23: 4 via TOPICAL

## 2018-03-22 MED ORDER — SIMETHICONE 40 MG/0.6ML PO SUSP (UNIT DOSE)
120.0000 mg | Freq: Three times a day (TID) | ORAL | Status: DC | PRN
  Filled 2018-03-22: qty 1.8

## 2018-03-22 MED ORDER — SODIUM CHLORIDE 1 G PO TABS
1.0000 g | ORAL_TABLET | Freq: Once | ORAL | Status: AC
  Administered 2018-03-23: 1 g via ORAL
  Filled 2018-03-22: qty 1

## 2018-03-22 MED ORDER — FENTANYL CITRATE (PF) 100 MCG/2ML IJ SOLN
50.0000 ug | INTRAMUSCULAR | Status: DC | PRN
  Administered 2018-03-22 (×2): 50 ug via INTRAVENOUS
  Filled 2018-03-22 (×2): qty 2

## 2018-03-22 MED ORDER — SODIUM CHLORIDE 0.9 % IV SOLN
INTRAVENOUS | Status: DC
  Administered 2018-03-22: 21:00:00 via INTRAVENOUS

## 2018-03-22 MED ORDER — POVIDONE-IODINE 10 % EX SWAB
2.0000 "application " | Freq: Once | CUTANEOUS | Status: AC
  Administered 2018-03-23: 2 via TOPICAL

## 2018-03-22 MED ORDER — HYDRALAZINE HCL 20 MG/ML IJ SOLN
5.0000 mg | INTRAMUSCULAR | Status: DC | PRN
  Administered 2018-03-23: 5 mg via INTRAVENOUS
  Filled 2018-03-22: qty 1

## 2018-03-22 MED ORDER — ARTIFICIAL TEARS OPHTHALMIC OINT
TOPICAL_OINTMENT | Freq: Three times a day (TID) | OPHTHALMIC | Status: DC
  Administered 2018-03-23: 1 via OPHTHALMIC
  Administered 2018-03-23 – 2018-03-25 (×5): via OPHTHALMIC
  Administered 2018-03-25: 1 via OPHTHALMIC
  Filled 2018-03-22: qty 3.5

## 2018-03-22 MED ORDER — MORPHINE SULFATE (PF) 2 MG/ML IV SOLN
0.5000 mg | INTRAVENOUS | Status: DC | PRN
  Administered 2018-03-23 (×2): 0.5 mg via INTRAVENOUS
  Filled 2018-03-22 (×2): qty 1

## 2018-03-22 NOTE — ED Notes (Signed)
Orthopedist in to assess pt. Family contacted to have orthopedist consult with plan of care.

## 2018-03-22 NOTE — ED Notes (Signed)
Nurse advised that she was going to call writer back for report.

## 2018-03-22 NOTE — ED Triage Notes (Signed)
Patient arrives from [facility] after an unwitnessed fall-staff came by room and found patient on the floor. Patient has pain on palpation to left hip area with no deformity or external rotation.

## 2018-03-22 NOTE — Consult Note (Signed)
Reason for Consult: Basicervical femoral neck fracture left Referring Physician: Hospitalist  Julie [name] is an 84 y.o. female.  HPI: Patient is an 84 year old female who has been in some form of decline over the last several months who fell earlier tonight and suffered a left basicervical femoral neck fracture.  She was evaluated by the emergency room and we are consulted for management of her basicervical femoral neck fracture.  The patient has been in ambulator within the confines of a rehabilitation facility recently.  Past Medical History:  Diagnosis Date  . Anxiety   . Cancer ([place])   . Hypertension     History reviewed. No pertinent surgical history.  Family History  Problem Relation Age of Onset  . Breast cancer Neg Hx     Social History:  reports that she has never smoked. She has never used smokeless tobacco. She reports that she does not drink alcohol or use drugs.  Allergies:  Allergies  Allergen Reactions  . Nitrofurantoin Monohyd Macro Nausea Only    Medications: I have reviewed the patient's current medications.  Results for orders placed or performed during the hospital encounter of 03/22/18 (from the past 48 hour(s))  Basic metabolic panel     Status: Abnormal   Collection Time: 03/22/18  9:02 PM  Result Value Ref Range   Sodium 135 135 - 145 mmol/L   Potassium 3.2 (L) 3.5 - 5.1 mmol/L   Chloride 100 98 - 111 mmol/L   CO2 26 22 - 32 mmol/L   Glucose, Bld 125 (H) 70 - 99 mg/dL   BUN 13 8 - 23 mg/dL   Creatinine, Ser 0.45 0.44 - 1.00 mg/dL   Calcium 8.8 (L) 8.9 - 10.3 mg/dL   GFR calc non Af Amer >60 >60 mL/min   GFR calc Af Amer >60 >60 mL/min    Comment: (NOTE) The eGFR has been calculated using the CKD EPI equation. This calculation has not been validated in all clinical situations. eGFR's persistently <60 mL/min signify possible Chronic Kidney Disease.    Anion gap 9 5 - 15    Comment: Performed at [facility], [place] [address], [place], [place] [address]  CBC WITH DIFFERENTIAL     Status: None   Collection Time: 03/22/18  9:02 PM  Result Value Ref Range   WBC 6.9 4.0 - 10.5 K/uL   RBC 3.87 3.87 - 5.11 MIL/uL   Hemoglobin 12.7 12.0 - 15.0 g/dL   HCT 38.5 36.0 - 46.0 %   MCV 99.5 80.0 - 100.0 fL   MCH 32.8 26.0 - 34.0 pg   MCHC 33.0 30.0 - 36.0 g/dL   RDW 12.8 11.5 - 15.5 %   Platelets 187 150 - 400 K/uL   nRBC 0.0 0.0 - 0.2 %   Neutrophils Relative % 80 %   Neutro Abs 5.5 1.7 - 7.7 K/uL   Lymphocytes Relative 11 %   Lymphs Abs 0.8 0.7 - 4.0 K/uL   Monocytes Relative 7 %   Monocytes Absolute 0.5 0.1 - 1.0 K/uL   Eosinophils Relative 1 %   Eosinophils Absolute 0.1 0.0 - 0.5 K/uL   Basophils Relative 0 %   Basophils Absolute 0.0 0.0 - 0.1 K/uL   Immature Granulocytes 1 %   Abs Immature Granulocytes 0.04 0.00 - 0.07 K/uL    Comment: Performed at [facility], [place] [address], [place], [place] [address]  Protime-INR     Status: None   Collection Time: 03/22/18  9:02 PM  Result Value Ref Range   Prothrombin Time 13.1 11.4 - 15.2 seconds   INR 1.00     Comment: Performed at [facility], [place] [address] [place], [place] [address]    Dg Chest 2 View  Result Date: 03/22/2018 CLINICAL DATA:  Unwitnessed fall.  Chest pain. EXAM: CHEST - 2 VIEW COMPARISON:  Radiographs of January 22, 2018. FINDINGS: Stable cardiomediastinal silhouette. Atherosclerosis of thoracic aorta is noted. No pneumothorax is noted. No significant pleural effusion is noted. Left lung is clear. Mild right lower lobe opacity is noted concerning for atelectasis or infiltrate. Bony thorax is unremarkable. IMPRESSION: Mild right lower lobe atelectasis or infiltrate is noted. Aortic Atherosclerosis (ICD10-I70.0). Electronically Signed   By: Marijo Conception, M.D.   On: 03/22/2018 21:47   Dg Hip Unilat W Or Wo Pelvis 2-3 Views Left  Result Date: 03/22/2018 CLINICAL DATA:  Left hip pain after unwitnessed fall.  EXAM: DG HIP (WITH OR WITHOUT PELVIS) 2-3V LEFT COMPARISON:  None. FINDINGS: Moderately angulated fracture is seen involving the left femoral neck. Old healed fractures of the left inferior and superior pubic rami are noted. IMPRESSION: Moderately angulated proximal left femoral neck fracture. Electronically Signed   By: Marijo Conception, M.D.   On: 03/22/2018 20:40    ROS  ROS: I have reviewed the patient's review of systems thoroughly and there are no positive responses as relates to the HPI. Blood pressure (!) 166/80, pulse 75, temperature 98.5 F (36.9 C), temperature source Axillary, resp. rate 17, height '5\' 4"'  (1.626 m), weight 40 kg, SpO2 94 %. Physical Exam Well-developed well-nourished patient in no acute distress. Alert and oriented x3 HEENT:within normal limits Cardiac: Regular rate and rhythm Pulmonary: Lungs clear to auscultation Abdomen: Soft and nontender.  Normal active bowel sounds  Musculoskeletal: (Left hip: Externally rotated and shortened.  Pain through range of motion.  Neurovascular intact distally.  Assessment/Plan: 84 year old female with what is read as a femoral neck fracture but I actually think is a basicervical femoral neck fracture.  She is a household ambulator and currently in a rehab facility.  She will be admitted to the internal medicine service and cleared for surgical intervention.//I had a long discussion with she and her daughter tonight.  They are both in agreement that fixation of this fracture makes sense.  They are certainly aware of the fact that she could have complications including but not limited to bleeding, infection, need for further surgery, failure fixation, and a slight chance of death and around the time of surgery.  They understand these risks and do wish to proceed with intra-medullary rod fixation of left hip fracture.  Alta Corning 03/22/2018, 9:58 PM

## 2018-03-22 NOTE — Progress Notes (Signed)
I was in a patient room, attempting to get 2200 meds passed when I received the first call from the ED and was trying to finish up when I got the second call.   I returned the call and got report at 2018.

## 2018-03-22 NOTE — ED Notes (Signed)
Bed: HK71 Expected date:  Expected time:  Means of arrival:  Comments: EMS 84 yo female from [facility]-hip pain/Hypertensive

## 2018-03-22 NOTE — ED Notes (Signed)
Report attempted was told to call back in 2 minutes.

## 2018-03-22 NOTE — H&P (Addendum)
History and Physical    Julie [name] OEU:[id number] DOB: 28-Jan-1931 DOA: 03/22/2018  PCP: Hayden Rasmussen, MD  Patient coming from: Nursing home.  Chief Complaint: Fall.  HPI: Julie [name] is a 84 y.o. female with history of hypertension who was recently admitted for severe hyponatremia at this time patient also was found to have paroxysmal atrial fibrillation was not placed on anticoagulation was eventually discharged to rehab.  Patient while walking 2 days slipped and fell and hurt her hip.  Denies losing consciousness or hitting her head.  Denies chest pain or palpitation.  ED Course: In the ER x-rays revealed left hip fracture Dr. Berenice Primas on-call orthopedic surgeon has been consulted.  EKG shows normal sinus rhythm.  Chest x-ray unremarkable.  Patient admitted for further work-up including surgery.  Review of Systems: As per HPI, rest all negative.   Past Medical History:  Diagnosis Date  . Anxiety   . Cancer ([place])   . Hypertension     History reviewed. No pertinent surgical history.   reports that she has never smoked. She has never used smokeless tobacco. She reports that she does not drink alcohol or use drugs.  Allergies  Allergen Reactions  . Nitrofurantoin Monohyd Macro Nausea Only    Family History  Problem Relation Age of Onset  . Breast cancer Neg Hx     Prior to Admission medications   Medication Sig Start Date End Date Taking? Authorizing Provider  acetaminophen (TYLENOL) 325 MG tablet Take 2 tablets (650 mg total) by mouth every 6 (six) hours as needed for mild pain, fever or headache. 01/28/18  Yes Cherene Altes, MD  artificial tears (LACRILUBE) OINT ophthalmic ointment Place into both eyes every 8 (eight) hours. 01/28/18  Yes Cherene Altes, MD  guaiFENesin (MUCINEX) 600 MG 12 hr tablet Take 600 mg by mouth 2 (two) times daily.   Yes [provider]  LORazepam (ATIVAN) 0.5 MG tablet Take 0.5 mg by mouth daily. 03/18/18  Yes [provider]  potassium chloride (K-DUR) 10 MEQ tablet Take 10 mEq by mouth 2 (two) times daily. 03/14/18  Yes [provider]  simethicone (MYLICON) 40 XV/4.0GQ SUSP Take 120 mg by mouth every 8 (eight) hours as needed for flatulence.   Yes [provider]  sodium chloride 1 g tablet Take 1 g by mouth once.   Yes [provider]  traMADol (ULTRAM) 50 MG tablet Take 25 mg by mouth every 6 (six) hours as needed for severe pain.   Yes [provider]    Physical Exam: Vitals:   03/22/18 2021 03/22/18 2124 03/22/18 2157 03/22/18 2241  BP: (!) 202/85 (!) 166/80  (!) 196/90  Pulse: 67 75  77  Resp: 15 17  16   Temp:   98.5 F (36.9 C) (!) 96.8 F (36 C)  TempSrc:   Axillary Axillary  SpO2: 98% 94%  100%  Weight: 40 kg     Height: 5\' 4"  (1.626 m)         Constitutional: Moderately built and nourished. Vitals:   03/22/18 2021 03/22/18 2124 03/22/18 2157 03/22/18 2241  BP: (!) 202/85 (!) 166/80  (!) 196/90  Pulse: 67 75  77  Resp: 15 17  16   Temp:   98.5 F (36.9 C) (!) 96.8 F (36 C)  TempSrc:   Axillary Axillary  SpO2: 98% 94%  100%  Weight: 40 kg     Height: 5\' 4"  (1.626 m)  Eyes: Anicteric no pallor. ENMT: No discharge from the ears eyes nose or mouth. Neck: No mass or.  No neck rigidity. Respiratory: No rhonchi or crepitations. Cardiovascular: S1-S2 heard no murmurs appreciated. Abdomen: Soft nontender bowel sounds present. Musculoskeletal: Pain on moving left hip. Skin: Skin rash on his left breast. Neurologic: Alert awake oriented to name and place moves all extremities. Psychiatric: Oriented to name and place.   Labs on Admission: I have personally reviewed following labs and imaging studies  CBC: Recent Labs  Lab 03/22/18 2102  WBC 6.9  NEUTROABS 5.5  HGB 12.7  HCT 38.5  MCV 99.5  PLT 347   Basic Metabolic Panel: Recent Labs  Lab 03/22/18 2102  NA 135  K 3.2*  CL 100  CO2 26  GLUCOSE 125*  BUN 13    CREATININE 0.45  CALCIUM 8.8*   GFR: Estimated Creatinine Clearance: 32.5 mL/min (by C-G formula based on SCr of 0.45 mg/dL). Liver Function Tests: No results for input(s): AST, ALT, ALKPHOS, BILITOT, PROT, ALBUMIN in the last 168 hours. No results for input(s): LIPASE, AMYLASE in the last 168 hours. No results for input(s): AMMONIA in the last 168 hours. Coagulation Profile: Recent Labs  Lab 03/22/18 2102  INR 1.00   Cardiac Enzymes: No results for input(s): CKTOTAL, CKMB, CKMBINDEX, TROPONINI in the last 168 hours. BNP (last 3 results) No results for input(s): PROBNP in the last 8760 hours. HbA1C: No results for input(s): HGBA1C in the last 72 hours. CBG: No results for input(s): GLUCAP in the last 168 hours. Lipid Profile: No results for input(s): CHOL, HDL, LDLCALC, TRIG, CHOLHDL, LDLDIRECT in the last 72 hours. Thyroid Function Tests: No results for input(s): TSH, T4TOTAL, FREET4, T3FREE, THYROIDAB in the last 72 hours. Anemia Panel: No results for input(s): VITAMINB12, FOLATE, FERRITIN, TIBC, IRON, RETICCTPCT in the last 72 hours. Urine analysis:    Component Value Date/Time   COLORURINE YELLOW 01/22/2018 1854   APPEARANCEUR HAZY (A) 01/22/2018 1854   LABSPEC 1.010 01/22/2018 1854   PHURINE 8.0 01/22/2018 1854   GLUCOSEU NEGATIVE 01/22/2018 1854   HGBUR NEGATIVE 01/22/2018 1854   BILIRUBINUR NEGATIVE 01/22/2018 1854   BILIRUBINUR NEG 07/19/2011 1606   KETONESUR 5 (A) 01/22/2018 1854   PROTEINUR NEGATIVE 01/22/2018 1854   UROBILINOGEN 0.2 07/19/2011 1606   NITRITE NEGATIVE 01/22/2018 1854   LEUKOCYTESUR NEGATIVE 01/22/2018 1854   Sepsis Labs: @LABRCNTIP (procalcitonin:4,lacticidven:4) )No results found for this or any previous visit (from the past 240 hour(s)).   Radiological Exams on Admission: Dg Chest 2 View  Result Date: 03/22/2018 CLINICAL DATA:  Unwitnessed fall.  Chest pain. EXAM: CHEST - 2 VIEW COMPARISON:  Radiographs of January 22, 2018.  FINDINGS: Stable cardiomediastinal silhouette. Atherosclerosis of thoracic aorta is noted. No pneumothorax is noted. No significant pleural effusion is noted. Left lung is clear. Mild right lower lobe opacity is noted concerning for atelectasis or infiltrate. Bony thorax is unremarkable. IMPRESSION: Mild right lower lobe atelectasis or infiltrate is noted. Aortic Atherosclerosis (ICD10-I70.0). Electronically Signed   By: Marijo Conception, M.D.   On: 03/22/2018 21:47   Dg Hip Unilat W Or Wo Pelvis 2-3 Views Left  Result Date: 03/22/2018 CLINICAL DATA:  Left hip pain after unwitnessed fall. EXAM: DG HIP (WITH OR WITHOUT PELVIS) 2-3V LEFT COMPARISON:  None. FINDINGS: Moderately angulated fracture is seen involving the left femoral neck. Old healed fractures of the left inferior and superior pubic rami are noted. IMPRESSION: Moderately angulated proximal left femoral neck fracture. Electronically Signed  By: Marijo Conception, M.D.   On: 03/22/2018 20:40    EKG: Independently reviewed.  Normal sinus rhythm.  Assessment/Plan Principal Problem:   Closed left hip fracture, initial encounter ([place]) Active Problems:   HTN (hypertension)   Cognitive decline   Hip fracture ([place])    1. Left hip fracture status post mechanical fall -patient is at moderate to high risk for intermediate risk procedure.  We will keep patient n.p.o. past midnight in anticipation of surgery pain relief medications. 2. Mild hypokalemia -corrected and recheck metabolic panel check magnesium levels. 3. Hypertension presently not on any medication we will keep patient on PRN IV hydralazine.  4. Paroxysmal atrial fibrillation presently in sinus rhythm not on anticoagulation. 5. Cognitive decline. 6. History of severe hyponatremia noted during last admission on sodium pills. 7. Left breast mass noted during last admission and family has requested no further work-up at that time. 8. Skin rash under the left breast need to be followed  up.   DVT prophylaxis: SCDs. Code Status: DNR. Family Communication: No family at the bedside. Disposition Plan: Back to rehab. Consults called: Orthopedics. Admission status: Inpatient.   Rise Patience MD [facility] Pager [phone].  If 7PM-7AM, please contact night-coverage [email or web address] Password [email or web address]  03/22/2018, 11:22 PM

## 2018-03-22 NOTE — ED Provider Notes (Signed)
[facility] DEPT Provider Note   CSN: [id number] Arrival date & time: 03/22/18  2006     History   Chief Complaint Chief Complaint  Patient presents with  . Fall  . Left hip pain    HPI Julie [name] is a 84 y.o. female.  84 year old female here after mechanical fall just prior to arrival.  States force was slick and she slipped and fell onto her left hip.  Denies any head or neck injury.  No chest or abdominal discomfort.  No lower back pain.  Complains of sharp pain at her left hip that is worse with movement and radiates down to her knee.  Denies any knee or lower extremity discomfort.  Unable to stand.  EMS called and patient transported here.     Past Medical History:  Diagnosis Date  . Anxiety   . Cancer ([place])   . Hypertension     Patient Active Problem List   Diagnosis Date Noted  . Protein-calorie malnutrition, severe 01/23/2018  . Anxiety 01/22/2018  . Mass of breast, left 01/22/2018  . Hyponatremia 01/22/2018  . Cognitive decline 01/22/2018  . Opacity of lung on imaging study 01/22/2018  . Anorexia 01/22/2018  . Weight loss 01/22/2018  . Hypokalemia 01/22/2018  . HTN (hypertension) 07/19/2011    No past surgical history on file.   OB History   None      Home Medications    Prior to Admission medications   Medication Sig Start Date End Date Taking? Authorizing Provider  acetaminophen (TYLENOL) 325 MG tablet Take 2 tablets (650 mg total) by mouth every 6 (six) hours as needed for mild pain, fever or headache. 01/28/18   Cherene Altes, MD  artificial tears (LACRILUBE) OINT ophthalmic ointment Place into both eyes every 8 (eight) hours. 01/28/18   Cherene Altes, MD  clonazePAM (KLONOPIN) 0.5 MG tablet Take 0.0125 mg by mouth as needed for anxiety.  12/18/17   [provider]  mirtazapine (REMERON) 30 MG tablet Take 15 mg by mouth at bedtime. 12/31/17   [provider]    Family History Family  History  Problem Relation Age of Onset  . Breast cancer Neg Hx     Social History Social History   Tobacco Use  . Smoking status: Never Smoker  . Smokeless tobacco: Never Used  Substance Use Topics  . Alcohol use: Never    Frequency: Never  . Drug use: Never     Allergies   Nitrofurantoin monohyd macro   Review of Systems Review of Systems  All other systems reviewed and are negative.    Physical Exam Updated Vital Signs There were no vitals taken for this visit.  Physical Exam  Constitutional: She is oriented to person, place, and time. She appears well-developed and well-nourished.  Non-toxic appearance. No distress.  HENT:  Head: Normocephalic and atraumatic.  Eyes: Pupils are equal, round, and reactive to light. Conjunctivae, EOM and lids are normal.  Neck: Normal range of motion. Neck supple. No tracheal deviation present. No thyroid mass present.  Cardiovascular: Normal rate, regular rhythm and normal heart sounds. Exam reveals no gallop.  No murmur heard. Pulmonary/Chest: Effort normal and breath sounds normal. No stridor. No respiratory distress. She has no decreased breath sounds. She has no wheezes. She has no rhonchi. She has no rales.  Abdominal: Soft. Normal appearance and bowel sounds are normal. She exhibits no distension. There is no tenderness. There is no rebound and no CVA tenderness.  Musculoskeletal: Normal range of motion. She exhibits no edema or tenderness.       Legs: No shortening or rotation of the left lower extremity.  Neurovascular intact at left foot.  Patient has normal dorsi and plantarflexion of the left foot  Neurological: She is alert and oriented to person, place, and time. She has normal strength. She displays no atrophy and no tremor. No cranial nerve deficit or sensory deficit. GCS eye subscore is 4. GCS verbal subscore is 5. GCS motor subscore is 6.  Strength is 5 out of 5 in all extremities except the left which is limited by  pain.  She has normal dorsi and plantarflexion  Skin: Skin is warm and dry. No abrasion and no rash noted.  Psychiatric: She has a normal mood and affect. Her speech is normal and behavior is normal.  Nursing note and vitals reviewed.    ED Treatments / Results  Labs (all labs ordered are listed, but only abnormal results are displayed) Labs Reviewed - No data to display  EKG None  Radiology No results found.  Procedures Procedures (including critical care time)  Medications Ordered in ED Medications - No data to display   Initial Impression / Assessment and Plan / ED Course  I have reviewed the triage vital signs and the nursing notes.  Pertinent labs & imaging results that were available during my care of the patient were reviewed by me and considered in my medical decision making (see chart for details).     Patient's x-ray consistent with left femoral neck fracture.  Discussed with Dr. Berenice Primas of orthopedic surgery who is been here to see the patient.  Will request hospitalist admission  Final Clinical Impressions(s) / ED Diagnoses   Final diagnoses:  None    ED Discharge Orders    None       Lacretia Leigh, MD 03/22/18 2139

## 2018-03-22 NOTE — ED Notes (Signed)
ED TO INPATIENT HANDOFF REPORT  Name/Age/Gender Julie [name] 84 y.o. female  Code Status Code Status History    Date Active Date Inactive Code Status Order ID Comments User Context   01/23/2018 0858 01/28/2018 2207 DNR [id number]  Juanito Doom, MD Inpatient   01/22/2018 1634 01/23/2018 0858 Full Code [id number]  Janora Norlander, MD ED   01/22/2018 1554 01/22/2018 1634 DNR [id number]  Rush Farmer, MD ED    Advance Directive Documentation     Most Recent Value  Type of Advance Directive  Out of facility DNR (pink MOST or yellow form)  Pre-existing out of facility DNR order (yellow form or pink MOST form)  Yellow form placed in chart (order not valid for inpatient use)  "MOST" Form in Place?  -      Home/SNF/Other Nursing Home  Chief Complaint Fall/Hip Pain  Level of Care/Admitting Diagnosis ED Disposition    ED Disposition Condition [place]: [facility] [[id number]]  Level of Care: Med-Surg [16]  Diagnosis: Hip fracture [facility]) [[id number]]  Admitting Physician: Rise Patience [phone]  Attending Physician: Rise Patience [phone]  Estimated length of stay: past midnight tomorrow  Certification:: I certify this patient will need inpatient services for at least 2 midnights  PT Class (Do Not Modify): Inpatient [101]  PT Acc Code (Do Not Modify): Private [1]       Medical History Past Medical History:  Diagnosis Date  . Anxiety   . Cancer ([place])   . Hypertension     Allergies Allergies  Allergen Reactions  . Nitrofurantoin Monohyd Macro Nausea Only    IV Location/Drains/Wounds Patient Lines/Drains/Airways Status   Active Line/Drains/Airways    Name:   Placement date:   Placement time:   Site:   Days:   Peripheral IV 03/22/18 Left Forearm   03/22/18    1958    Forearm   less than 1          Labs/Imaging Results for orders placed or performed during the hospital encounter of 03/22/18 (from the past 48 hour(s))   Basic metabolic panel     Status: Abnormal   Collection Time: 03/22/18  9:02 PM  Result Value Ref Range   Sodium 135 135 - 145 mmol/L   Potassium 3.2 (L) 3.5 - 5.1 mmol/L   Chloride 100 98 - 111 mmol/L   CO2 26 22 - 32 mmol/L   Glucose, Bld 125 (H) 70 - 99 mg/dL   BUN 13 8 - 23 mg/dL   Creatinine, Ser 0.45 0.44 - 1.00 mg/dL   Calcium 8.8 (L) 8.9 - 10.3 mg/dL   GFR calc non Af Amer >60 >60 mL/min   GFR calc Af Amer >60 >60 mL/min    Comment: (NOTE) The eGFR has been calculated using the CKD EPI equation. This calculation has not been validated in all clinical situations. eGFR's persistently <60 mL/min signify possible Chronic Kidney Disease.    Anion gap 9 5 - 15    Comment: Performed at [facility], [place] [address], [place], [place] [address]  CBC WITH DIFFERENTIAL     Status: None   Collection Time: 03/22/18  9:02 PM  Result Value Ref Range   WBC 6.9 4.0 - 10.5 K/uL   RBC 3.87 3.87 - 5.11 MIL/uL   Hemoglobin 12.7 12.0 - 15.0 g/dL   HCT 38.5 36.0 - 46.0 %   MCV 99.5 80.0 - 100.0 fL   MCH 32.8  26.0 - 34.0 pg   MCHC 33.0 30.0 - 36.0 g/dL   RDW 12.8 11.5 - 15.5 %   Platelets 187 150 - 400 K/uL   nRBC 0.0 0.0 - 0.2 %   Neutrophils Relative % 80 %   Neutro Abs 5.5 1.7 - 7.7 K/uL   Lymphocytes Relative 11 %   Lymphs Abs 0.8 0.7 - 4.0 K/uL   Monocytes Relative 7 %   Monocytes Absolute 0.5 0.1 - 1.0 K/uL   Eosinophils Relative 1 %   Eosinophils Absolute 0.1 0.0 - 0.5 K/uL   Basophils Relative 0 %   Basophils Absolute 0.0 0.0 - 0.1 K/uL   Immature Granulocytes 1 %   Abs Immature Granulocytes 0.04 0.00 - 0.07 K/uL    Comment: Performed at [facility], [place] [address], [place], [place] [address]  Protime-INR     Status: None   Collection Time: 03/22/18  9:02 PM  Result Value Ref Range   Prothrombin Time 13.1 11.4 - 15.2 seconds   INR 1.00     Comment: Performed at [facility], [place] [address] [place], [place]  [address]   Dg Chest 2 View  Result Date: 03/22/2018 CLINICAL DATA:  Unwitnessed fall.  Chest pain. EXAM: CHEST - 2 VIEW COMPARISON:  Radiographs of January 22, 2018. FINDINGS: Stable cardiomediastinal silhouette. Atherosclerosis of thoracic aorta is noted. No pneumothorax is noted. No significant pleural effusion is noted. Left lung is clear. Mild right lower lobe opacity is noted concerning for atelectasis or infiltrate. Bony thorax is unremarkable. IMPRESSION: Mild right lower lobe atelectasis or infiltrate is noted. Aortic Atherosclerosis (ICD10-I70.0). Electronically Signed   By: Marijo Conception, M.D.   On: 03/22/2018 21:47   Dg Hip Unilat W Or Wo Pelvis 2-3 Views Left  Result Date: 03/22/2018 CLINICAL DATA:  Left hip pain after unwitnessed fall. EXAM: DG HIP (WITH OR WITHOUT PELVIS) 2-3V LEFT COMPARISON:  None. FINDINGS: Moderately angulated fracture is seen involving the left femoral neck. Old healed fractures of the left inferior and superior pubic rami are noted. IMPRESSION: Moderately angulated proximal left femoral neck fracture. Electronically Signed   By: Marijo Conception, M.D.   On: 03/22/2018 20:40   EKG Interpretation  Date/Time:  Saturday March 22 2018 21:11:27 EST Ventricular Rate:  78 PR Interval:    QRS Duration: 95 QT Interval:  400 QTC Calculation: 456 R Axis:   -4 Text Interpretation:  Sinus rhythm Abnormal R-wave progression, early transition Consider anterior infarct Nonspecific T abnormalities, inferior leads Confirmed by Lacretia Leigh ([id number]) on 03/22/2018 9:15:21 PM   Pending Labs Unresulted Labs (From admission, onward)    Start     Ordered   03/22/18 2046  Type and screen [place],   STAT    Comments:  [place]    03/22/18 2045          Vitals/Pain Today's Vitals   03/22/18 2021 03/22/18 2124 03/22/18 2157  BP: (!) 202/85 (!) 166/80   Pulse: 67 75   Resp: 15 17   Temp:   98.5 F (36.9 C)   TempSrc:   Axillary  SpO2: 98% 94%   Weight: 40 kg    Height: '5\' 4"'  (1.626 m)      Isolation Precautions No active isolations  Medications Medications  0.9 %  sodium chloride infusion ( Intravenous New Bag/Given 03/22/18 2106)  fentaNYL (SUBLIMAZE) injection 50 mcg (50 mcg Intravenous Given 03/22/18  2120)  ondansetron (ZOFRAN) injection 4 mg (4 mg Intravenous Given 03/22/18 2119)    Mobility walks with device

## 2018-03-22 NOTE — ED Notes (Signed)
Telephone consent obtained for surgery in morning from [place], Daughter with witness by Probation officer and Dr.Graves.

## 2018-03-23 ENCOUNTER — Inpatient Hospital Stay (HOSPITAL_COMMUNITY): Payer: Medicare Other | Admitting: Certified Registered"

## 2018-03-23 ENCOUNTER — Encounter (HOSPITAL_COMMUNITY): Payer: Self-pay | Admitting: *Deleted

## 2018-03-23 ENCOUNTER — Other Ambulatory Visit: Payer: Self-pay

## 2018-03-23 ENCOUNTER — Inpatient Hospital Stay (HOSPITAL_COMMUNITY): Payer: Medicare Other

## 2018-03-23 ENCOUNTER — Encounter (HOSPITAL_COMMUNITY)
Admission: EM | Disposition: A | Discharge status: Skilled Nursing Facility | Payer: Self-pay | Source: Skilled Nursing Facility | Attending: Internal Medicine

## 2018-03-23 HISTORY — PX: INTRAMEDULLARY (IM) NAIL INTERTROCHANTERIC: SHX5875

## 2018-03-23 LAB — BASIC METABOLIC PANEL
Anion gap: 11 (ref 5–15)
BUN: 10 mg/dL (ref 8–23)
CHLORIDE: 99 mmol/L (ref 98–111)
CO2: 24 mmol/L (ref 22–32)
Calcium: 8.5 mg/dL — ABNORMAL LOW (ref 8.9–10.3)
Creatinine, Ser: 0.44 mg/dL (ref 0.44–1.00)
GFR calc non Af Amer: >60 mL/min (ref >60)
Glucose, Bld: 111 mg/dL — ABNORMAL HIGH (ref 70–99)
Potassium: 3.6 mmol/L (ref 3.5–5.1)
SODIUM: 134 mmol/L — AB (ref 135–145)

## 2018-03-23 LAB — MRSA PCR SCREENING: MRSA by PCR: NEGATIVE

## 2018-03-23 LAB — CBC
HEMATOCRIT: 40.2 % (ref 36.0–46.0)
HEMOGLOBIN: 13.1 g/dL (ref 12.0–15.0)
MCH: 32.7 pg (ref 26.0–34.0)
MCHC: 32.6 g/dL (ref 30.0–36.0)
MCV: 100.2 fL — ABNORMAL HIGH (ref 80.0–100.0)
Platelets: 206 10*3/uL (ref 150–400)
RBC: 4.01 MIL/uL (ref 3.87–5.11)
RDW: 12.7 % (ref 11.5–15.5)
WBC: 11.4 10*3/uL — AB (ref 4.0–10.5)
nRBC: 0 % (ref 0.0–0.2)

## 2018-03-23 LAB — MAGNESIUM: Magnesium: 1.7 mg/dL (ref 1.7–2.4)

## 2018-03-23 LAB — ABO/RH: ABO/RH(D): AB POS

## 2018-03-23 SURGERY — FIXATION, FRACTURE, INTERTROCHANTERIC, WITH INTRAMEDULLARY ROD
Anesthesia: General | Site: Hip | Laterality: Left

## 2018-03-23 MED ORDER — FENTANYL CITRATE (PF) 100 MCG/2ML IJ SOLN
INTRAMUSCULAR | Status: AC
  Filled 2018-03-23: qty 2

## 2018-03-23 MED ORDER — LORAZEPAM 0.5 MG PO TABS
0.5000 mg | ORAL_TABLET | Freq: Every day | ORAL | Status: DC
  Administered 2018-03-23 – 2018-03-25 (×3): 0.5 mg via ORAL
  Filled 2018-03-23 (×3): qty 1

## 2018-03-23 MED ORDER — METHOCARBAMOL 500 MG IVPB - SIMPLE MED
500.0000 mg | Freq: Four times a day (QID) | INTRAVENOUS | Status: DC | PRN
  Filled 2018-03-23: qty 50

## 2018-03-23 MED ORDER — ONDANSETRON HCL 4 MG/2ML IJ SOLN: 4.0000 mg | Freq: Four times a day (QID) | INTRAMUSCULAR | Status: DC | PRN

## 2018-03-23 MED ORDER — ONDANSETRON HCL 4 MG/2ML IJ SOLN
INTRAMUSCULAR | Status: DC | PRN
  Administered 2018-03-23: 4 mg via INTRAVENOUS

## 2018-03-23 MED ORDER — ACETAMINOPHEN 325 MG PO TABS
325.0000 mg | ORAL_TABLET | Freq: Four times a day (QID) | ORAL | Status: DC | PRN
  Administered 2018-03-26 (×2): 650 mg via ORAL
  Filled 2018-03-23 (×3): qty 2

## 2018-03-23 MED ORDER — CEFAZOLIN SODIUM-DEXTROSE 2-4 GM/100ML-% IV SOLN
2.0000 g | Freq: Four times a day (QID) | INTRAVENOUS | Status: AC
  Administered 2018-03-23 (×2): 2 g via INTRAVENOUS
  Filled 2018-03-23 (×2): qty 100

## 2018-03-23 MED ORDER — ROCURONIUM BROMIDE 10 MG/ML (PF) SYRINGE
PREFILLED_SYRINGE | INTRAVENOUS | Status: AC
  Filled 2018-03-23: qty 10

## 2018-03-23 MED ORDER — DOCUSATE SODIUM 100 MG PO CAPS
100.0000 mg | ORAL_CAPSULE | Freq: Two times a day (BID) | ORAL | Status: DC
  Administered 2018-03-23 – 2018-03-25 (×4): 100 mg via ORAL
  Filled 2018-03-23 (×4): qty 1

## 2018-03-23 MED ORDER — SUGAMMADEX SODIUM 200 MG/2ML IV SOLN
INTRAVENOUS | Status: DC | PRN
  Administered 2018-03-23: 100 mg via INTRAVENOUS

## 2018-03-23 MED ORDER — PROPOFOL 10 MG/ML IV BOLUS
INTRAVENOUS | Status: DC | PRN
  Administered 2018-03-23: 110 mg via INTRAVENOUS

## 2018-03-23 MED ORDER — FERROUS SULFATE 325 (65 FE) MG PO TABS
325.0000 mg | ORAL_TABLET | Freq: Every day | ORAL | Status: DC
  Administered 2018-03-24 – 2018-03-26 (×3): 325 mg via ORAL
  Filled 2018-03-23 (×3): qty 1

## 2018-03-23 MED ORDER — ACETAMINOPHEN 500 MG PO TABS
500.0000 mg | ORAL_TABLET | Freq: Four times a day (QID) | ORAL | Status: AC
  Administered 2018-03-23 – 2018-03-24 (×4): 500 mg via ORAL
  Filled 2018-03-23 (×5): qty 1

## 2018-03-23 MED ORDER — DEXAMETHASONE SODIUM PHOSPHATE 10 MG/ML IJ SOLN
INTRAMUSCULAR | Status: DC | PRN
  Administered 2018-03-23: 8 mg via INTRAVENOUS

## 2018-03-23 MED ORDER — SODIUM CHLORIDE 1 G PO TABS: 1.0000 g | ORAL_TABLET | Freq: Every day | ORAL | Status: DC

## 2018-03-23 MED ORDER — PRO-STAT SUGAR FREE PO LIQD
30.0000 mL | Freq: Two times a day (BID) | ORAL | Status: DC
  Administered 2018-03-23 – 2018-03-26 (×6): 30 mL via ORAL
  Filled 2018-03-23 (×6): qty 30

## 2018-03-23 MED ORDER — HYDROCODONE-ACETAMINOPHEN 5-325 MG PO TABS
1.0000 | ORAL_TABLET | Freq: Four times a day (QID) | ORAL | Status: DC | PRN
  Administered 2018-03-24 – 2018-03-25 (×5): 1 via ORAL
  Filled 2018-03-23 (×5): qty 1

## 2018-03-23 MED ORDER — LACTATED RINGERS IV SOLN
INTRAVENOUS | Status: DC | PRN
  Administered 2018-03-23: 07:00:00 via INTRAVENOUS

## 2018-03-23 MED ORDER — ONDANSETRON HCL 4 MG/2ML IJ SOLN
INTRAMUSCULAR | Status: AC
  Filled 2018-03-23: qty 2

## 2018-03-23 MED ORDER — SODIUM CHLORIDE 0.9 % IV SOLN: INTRAVENOUS | Status: DC

## 2018-03-23 MED ORDER — OXYCODONE HCL 5 MG PO TABS: 5.0000 mg | ORAL_TABLET | Freq: Once | ORAL | Status: DC | PRN

## 2018-03-23 MED ORDER — FENTANYL CITRATE (PF) 100 MCG/2ML IJ SOLN
INTRAMUSCULAR | Status: DC | PRN
  Administered 2018-03-23: 50 ug via INTRAVENOUS
  Administered 2018-03-23 (×2): 25 ug via INTRAVENOUS

## 2018-03-23 MED ORDER — FENTANYL CITRATE (PF) 100 MCG/2ML IJ SOLN
25.0000 ug | INTRAMUSCULAR | Status: DC | PRN
  Administered 2018-03-23 (×2): 25 ug via INTRAVENOUS
  Administered 2018-03-23: 50 ug via INTRAVENOUS

## 2018-03-23 MED ORDER — ROCURONIUM BROMIDE 10 MG/ML (PF) SYRINGE
PREFILLED_SYRINGE | INTRAVENOUS | Status: DC | PRN
  Administered 2018-03-23: 40 mg via INTRAVENOUS

## 2018-03-23 MED ORDER — TRAMADOL HCL 50 MG PO TABS: 50.0000 mg | ORAL_TABLET | Freq: Four times a day (QID) | ORAL | 0 refills | Status: DC | PRN

## 2018-03-23 MED ORDER — ASPIRIN EC 325 MG PO TBEC
325.0000 mg | DELAYED_RELEASE_TABLET | Freq: Every day | ORAL | Status: DC
  Administered 2018-03-24 – 2018-03-26 (×3): 325 mg via ORAL
  Filled 2018-03-23 (×3): qty 1

## 2018-03-23 MED ORDER — BISACODYL 5 MG PO TBEC: 5.0000 mg | DELAYED_RELEASE_TABLET | Freq: Every day | ORAL | Status: DC | PRN

## 2018-03-23 MED ORDER — ALUM & MAG HYDROXIDE-SIMETH 200-200-20 MG/5ML PO SUSP: 30.0000 mL | ORAL | Status: DC | PRN

## 2018-03-23 MED ORDER — 0.9 % SODIUM CHLORIDE (POUR BTL) OPTIME
TOPICAL | Status: DC | PRN
  Administered 2018-03-23: 1000 mL

## 2018-03-23 MED ORDER — PROPOFOL 10 MG/ML IV BOLUS
INTRAVENOUS | Status: AC
  Filled 2018-03-23: qty 20

## 2018-03-23 MED ORDER — METHOCARBAMOL 500 MG PO TABS
500.0000 mg | ORAL_TABLET | Freq: Four times a day (QID) | ORAL | Status: DC | PRN
  Administered 2018-03-24 – 2018-03-25 (×3): 500 mg via ORAL
  Filled 2018-03-23 (×3): qty 1

## 2018-03-23 MED ORDER — DEXTROSE-NACL 5-0.45 % IV SOLN: INTRAVENOUS | Status: DC

## 2018-03-23 MED ORDER — ENSURE ENLIVE PO LIQD
237.0000 mL | Freq: Three times a day (TID) | ORAL | Status: DC
  Administered 2018-03-24 (×2): 237 mL via ORAL

## 2018-03-23 MED ORDER — DEXAMETHASONE SODIUM PHOSPHATE 10 MG/ML IJ SOLN
INTRAMUSCULAR | Status: AC
  Filled 2018-03-23: qty 1

## 2018-03-23 MED ORDER — TRAMADOL HCL 50 MG PO TABS: 50.0000 mg | ORAL_TABLET | Freq: Four times a day (QID) | ORAL | Status: DC | PRN

## 2018-03-23 MED ORDER — MAGNESIUM CITRATE PO SOLN: 1.0000 | Freq: Once | ORAL | Status: DC | PRN

## 2018-03-23 MED ORDER — PHENYLEPHRINE 40 MCG/ML (10ML) SYRINGE FOR IV PUSH (FOR BLOOD PRESSURE SUPPORT)
PREFILLED_SYRINGE | INTRAVENOUS | Status: AC
  Filled 2018-03-23: qty 10

## 2018-03-23 MED ORDER — LORAZEPAM 0.5 MG PO TABS: 0.5000 mg | ORAL_TABLET | Freq: Every day | ORAL | Status: DC

## 2018-03-23 MED ORDER — OXYCODONE HCL 5 MG/5ML PO SOLN
5.0000 mg | Freq: Once | ORAL | Status: DC | PRN
  Filled 2018-03-23: qty 5

## 2018-03-23 MED ORDER — SODIUM CHLORIDE 1 G PO TABS
1.0000 g | ORAL_TABLET | Freq: Every day | ORAL | Status: DC
  Administered 2018-03-23: 1 g via ORAL
  Filled 2018-03-23: qty 1

## 2018-03-23 MED ORDER — LIDOCAINE 2% (20 MG/ML) 5 ML SYRINGE
INTRAMUSCULAR | Status: DC | PRN
  Administered 2018-03-23: 100 mg via INTRAVENOUS

## 2018-03-23 MED ORDER — LIDOCAINE 2% (20 MG/ML) 5 ML SYRINGE
INTRAMUSCULAR | Status: AC
  Filled 2018-03-23: qty 5

## 2018-03-23 MED ORDER — PHENYLEPHRINE 40 MCG/ML (10ML) SYRINGE FOR IV PUSH (FOR BLOOD PRESSURE SUPPORT)
PREFILLED_SYRINGE | INTRAVENOUS | Status: DC | PRN
  Administered 2018-03-23 (×2): 80 ug via INTRAVENOUS

## 2018-03-23 MED ORDER — POLYETHYLENE GLYCOL 3350 17 G PO PACK: 17.0000 g | PACK | Freq: Every day | ORAL | Status: DC | PRN

## 2018-03-23 MED ORDER — ONDANSETRON HCL 4 MG PO TABS: 4.0000 mg | ORAL_TABLET | Freq: Four times a day (QID) | ORAL | Status: DC | PRN

## 2018-03-23 MED ORDER — POTASSIUM CHLORIDE CRYS ER 10 MEQ PO TBCR
10.0000 meq | EXTENDED_RELEASE_TABLET | Freq: Two times a day (BID) | ORAL | Status: DC
  Administered 2018-03-23 – 2018-03-24 (×3): 10 meq via ORAL
  Filled 2018-03-23 (×3): qty 1

## 2018-03-23 MED ORDER — SUGAMMADEX SODIUM 200 MG/2ML IV SOLN
INTRAVENOUS | Status: AC
  Filled 2018-03-23: qty 2

## 2018-03-23 SURGICAL SUPPLY — 34 items
BAG ZIPLOCK 12X15 (MISCELLANEOUS) ×3 IMPLANT
BIT DRILL 4.3MMS DISTAL GRDTED (BIT) ×1 IMPLANT
COVER SURGICAL LIGHT HANDLE (MISCELLANEOUS) ×3 IMPLANT
DRILL 4.3MMS DISTAL GRADUATED (BIT) ×3
DRSG MEPILEX BORDER 4X4 (GAUZE/BANDAGES/DRESSINGS) ×3 IMPLANT
DRSG MEPILEX BORDER 4X8 (GAUZE/BANDAGES/DRESSINGS) ×3 IMPLANT
DURAPREP 26ML APPLICATOR (WOUND CARE) ×3 IMPLANT
ELECT REM PT RETURN 15FT ADLT (MISCELLANEOUS) ×3 IMPLANT
GAUZE XEROFORM 1X8 LF (GAUZE/BANDAGES/DRESSINGS) ×3 IMPLANT
GLOVE BIO SURGEON STRL SZ8 (GLOVE) ×6 IMPLANT
GLOVE ECLIPSE 7.5 STRL STRAW (GLOVE) ×6 IMPLANT
GOWN STRL REUS W/ TWL XL LVL3 (GOWN DISPOSABLE) ×1 IMPLANT
GOWN STRL REUS W/TWL LRG LVL3 (GOWN DISPOSABLE) ×3 IMPLANT
GOWN STRL REUS W/TWL XL LVL3 (GOWN DISPOSABLE) ×2
GUIDEPIN 3.2X17.5 THRD DISP (PIN) ×6 IMPLANT
GUIDEWIRE BALL NOSE 80CM (WIRE) ×3 IMPLANT
HIP FRA NAIL LAG SCREW 10.5X90 (Orthopedic Implant) ×3 IMPLANT
HIP FRAC NAIL LEFT 11X360MM (Orthopedic Implant) ×3 IMPLANT
KIT BASIN OR (CUSTOM PROCEDURE TRAY) ×3 IMPLANT
MANIFOLD NEPTUNE II (INSTRUMENTS) ×3 IMPLANT
NAIL HIP FRAC LEFT 11X360MM (Orthopedic Implant) ×1 IMPLANT
NS IRRIG 1000ML POUR BTL (IV SOLUTION) ×3 IMPLANT
PACK TOTAL JOINT (CUSTOM PROCEDURE TRAY) ×3 IMPLANT
POSITIONER SURGICAL ARM (MISCELLANEOUS) ×3 IMPLANT
SCREW BONE CORTICAL 5.0X40 (Screw) ×3 IMPLANT
SCREW BONE CORTICAL 5.0X44 (Screw) ×3 IMPLANT
SCREW LAG HIP FRA NAIL 10.5X90 (Orthopedic Implant) ×1 IMPLANT
STAPLER VISISTAT 35W (STAPLE) ×3 IMPLANT
SUT VIC AB 0 CT1 27 (SUTURE) ×2
SUT VIC AB 0 CT1 27XBRD ANTBC (SUTURE) ×1 IMPLANT
SUT VIC AB 2-0 CT1 27 (SUTURE) ×4
SUT VIC AB 2-0 CT1 TAPERPNT 27 (SUTURE) ×2 IMPLANT
TOWEL OR 17X26 10 PK STRL BLUE (TOWEL DISPOSABLE) ×3 IMPLANT
TRAY FOLEY CATH 14FRSI W/METER (CATHETERS) ×3 IMPLANT

## 2018-03-23 NOTE — Brief Op Note (Signed)
03/22/2018 - 03/23/2018  8:54 AM  PATIENT:  Julie [name]  84 y.o. female  PRE-OPERATIVE DIAGNOSIS:  Left femoral neck fracture  POST-OPERATIVE DIAGNOSIS:  Left femoral neck fracture  PROCEDURE:  Procedure(s): INTRAMEDULLARY (IM) NAIL INTERTROCHANTRIC (Left)  SURGEON:  Surgeon(s) and Role:    Dorna Leitz, MD - Primary  PHYSICIAN ASSISTANT:   ASSISTANTS: jim bethune   ANESTHESIA:   general  EBL:  75 mL   BLOOD ADMINISTERED:none  DRAINS: none   LOCAL MEDICATIONS USED:  NONE  SPECIMEN:  No Specimen  DISPOSITION OF SPECIMEN:  N/A  COUNTS:  YES  TOURNIQUET:  * No tourniquets in log *  DICTATION: .Other Dictation: Dictation Number [id number]  PLAN OF CARE: Admit to inpatient   PATIENT DISPOSITION:  PACU - hemodynamically stable.   Delay start of Pharmacological VTE agent (>24hrs) due to surgical blood loss or risk of bleeding: no

## 2018-03-23 NOTE — Transfer of Care (Signed)
Immediate Anesthesia Transfer of Care Note  Patient: Julie [name]  Procedure(s) Performed: INTRAMEDULLARY (IM) NAIL INTERTROCHANTRIC (Left Hip)  Patient Location: PACU  Anesthesia Type:General  Level of Consciousness: awake, alert  and oriented  Airway & Oxygen Therapy: Patient Spontanous Breathing and Patient connected to face mask oxygen  Post-op Assessment: Report given to RN and Post -op Vital signs reviewed and stable  Post vital signs: Reviewed and stable  Last Vitals:  Vitals Value Taken Time  BP 187/88 03/23/2018  9:15 AM  Temp    Pulse 87 03/23/2018  9:15 AM  Resp 21 03/23/2018  9:15 AM  SpO2 100 % 03/23/2018  9:15 AM  Vitals shown include unvalidated device data.  Last Pain:  Vitals:   03/23/18 0606  TempSrc: Oral  PainSc:          Complications: No apparent anesthesia complications

## 2018-03-23 NOTE — Progress Notes (Signed)
Initial Nutrition Assessment  DOCUMENTATION CODES:   Underweight  INTERVENTION:  Monitor PO intake -Ensure Enlive po TID, each supplement provides 350 kcal and 20 grams of protein -Magic cup TID with meals, each supplement provides 290 kcal and 9 grams of protein -Prostat BID provides 100 kcals, 15 grams protein per serving   NUTRITION DIAGNOSIS:   Increased nutrient needs related to post-op healing, hip fracture as evidenced by other (comment)(underwt, low BMI, sheduled PT and rehab placement).   GOAL:   Patient will meet greater than or equal to 90% of their needs   MONITOR:   PO intake, Supplement acceptance, Weight trends  REASON FOR ASSESSMENT:   Consult Hip fracture protocol  ASSESSMENT:  84 yo female pt recently admitted for severe hyponatremia, atrial fibrillation and discharged to rehab where she slipped on day 2, fracturing her left hip. PMH: HTN, Cognitive decline, Severe malnutrition.    Pt w/ recent return from OR for left hip fracture and sleeping at visit. RD to provide ONS recs and obtain dietary history and NFPE at f/u.  Per chart, pt is vegetarian.  Medications: Colace, Potassium chloride 62mEq tablet, Morphine Labs: Na 134 (L) replacing  Dextrose 5% @ 73mL/hr providing: 204kcal/day  NUTRITION - FOCUSED PHYSICAL EXAM: Unable to access d/t pt status at visit   Most Recent Value  Orbital Region  Moderate depletion  Upper Arm Region  Unable to assess [d/t pt status at visit]  Thoracic and Lumbar Region  Unable to assess  Buccal Region  Moderate depletion  Temple Region  Moderate depletion  Clavicle Bone Region  Unable to assess  Clavicle and Acromion Bone Region  Unable to assess  Scapular Bone Region  Unable to assess  Dorsal Hand  Unable to assess  Patellar Region  Unable to assess  Anterior Thigh Region  Unable to assess  Posterior Calf Region  Unable to assess  Edema (RD Assessment)  None  Hair  Reviewed  Eyes  Unable to assess  Mouth   Unable to assess  Skin  Unable to assess  Nails  Unable to assess       Diet Order:   Diet Order            Diet regular Room service appropriate? Yes; Fluid consistency: Thin  Diet effective now              EDUCATION NEEDS:   No education needs have been identified at this time  Skin:  Skin Assessment: Reviewed RN Assessment(rash; under left breast, closed incision; left hip)  Last BM:  03/23/18 WDL  Height:   Ht Readings from Last 1 Encounters:  03/22/18 5\' 4"  (1.626 m)    Weight:   Wt Readings from Last 1 Encounters:  03/22/18 40 kg    Ideal Body Weight:  54.5 kg  BMI:  Body mass index is 15.14 kg/m.  Estimated Nutritional Needs:   Kcal:  4132-4401  Protein:  60-72g  Fluid:  1L/day    Lajuan Lines, RD, LDN  After Hours/Weekend Pager: [phone]

## 2018-03-23 NOTE — Anesthesia Postprocedure Evaluation (Signed)
Anesthesia Post Note  Patient: Julie [name]  Procedure(s) Performed: INTRAMEDULLARY (IM) NAIL INTERTROCHANTRIC (Left Hip)     Patient location during evaluation: PACU Anesthesia Type: General Level of consciousness: awake and alert Pain management: pain level controlled Vital Signs Assessment: post-procedure vital signs reviewed and stable Respiratory status: spontaneous breathing, nonlabored ventilation, respiratory function stable and patient connected to nasal cannula oxygen Cardiovascular status: blood pressure returned to baseline and stable Postop Assessment: no apparent nausea or vomiting Anesthetic complications: no    Last Vitals:  Vitals:   03/23/18 1231 03/23/18 1350  BP: (!) 143/83 (!) 149/93  Pulse: (!) 109 (!) 113  Resp: 16 16  Temp:  36.5 C  SpO2: 100% 99%    Last Pain:  Vitals:   03/23/18 1035  TempSrc: Axillary  PainSc: 0-No pain                 Akirah Storck S

## 2018-03-23 NOTE — Progress Notes (Signed)
PROGRESS NOTE    Julie [name]  DXA:[id number] DOB: 1930/12/28 DOA: 03/22/2018 PCP: Hayden Rasmussen, MD   Brief Narrative: : Julie [name] is a 84 y.o. female with history of hypertension who was recently admitted for severe hyponatremia at this time patient also was found to have paroxysmal atrial fibrillation was not placed on anticoagulation was eventually discharged to rehab.  Patient while walking 2 days slipped and fell and hurt her hip.  Denies losing consciousness or hitting her head.  Denies chest pain or palpitation.  ED Course: In the ER x-rays revealed left hip fracture Dr. Berenice Primas on-call orthopedic surgeon has been consulted.  EKG shows normal sinus rhythm.  Chest x-ray unremarkable.  Patient admitted for further work-up including surgery.   Assessment & Plan:   Principal Problem:   Closed left hip fracture, initial encounter ([place]) Active Problems:   HTN (hypertension)   Cognitive decline   Hip fracture (HCC)   Left hip fracture;  OR 11-10.  Management per ortho.  Bowel regimen.   Hypokalemia; replete orally  HTN;  PRN hydralazine.   PAF;  Not on anticoagulation prior to admission.   History of hyponatremia; last admission thought to be related to diuretics and or SIADH.  On sodium tablet, continue.   Left breast mass, notice during last admission. Per documentation, family has requested not further work up/.       DVT prophylaxis: aspirin Code Status: DNR Family Communication: grandson, was at bedside at time of my rounds.  Disposition Plan: To be determine.   Consultants:   Ortho   Procedures:   sx   Antimicrobials:  none   Subjective: Sleepy, wake up, report pain is controlled.    Objective: Vitals:   03/23/18 1015 03/23/18 1035 03/23/18 1147 03/23/18 1231  BP: (!) 151/86 (!) 150/83 121/77 (!) 143/83  Pulse: (!) 105 (!) 106 99 (!) 109  Resp: 13 12 14 16   Temp:  97.6 F (36.4 C)    TempSrc:  Axillary    SpO2: 100% 99% 99%  100%  Weight:      Height:        Intake/Output Summary (Last 24 hours) at 03/23/2018 1328 Last data filed at 03/23/2018 1015 Gross per 24 hour  Intake 1250.16 ml  Output 1135 ml  Net 115.16 ml   Filed Weights   03/22/18 2021  Weight: 40 kg    Examination:  General exam: sleepy  Respiratory system: Clear to auscultation. Respiratory effort normal. Cardiovascular system: S1 & S2 heard, RRR. No JVD, murmurs, rubs, gallops or clicks. No pedal edema. Gastrointestinal system: Abdomen is nondistended, soft and nontender. No organomegaly or masses felt. Normal bowel sounds heard. Central nervous system: sleepy, post sx Extremities: no edema, left hip with dressing  Skin: No rashes, lesions or ulcers    Data Reviewed: I have personally reviewed following labs and imaging studies  CBC: Recent Labs  Lab 03/22/18 2102 03/23/18 0413  WBC 6.9 11.4*  NEUTROABS 5.5  --   HGB 12.7 13.1  HCT 38.5 40.2  MCV 99.5 100.2*  PLT 187 209   Basic Metabolic Panel: Recent Labs  Lab 03/22/18 2102 03/23/18 0413  NA 135 134*  K 3.2* 3.6  CL 100 99  CO2 26 24  GLUCOSE 125* 111*  BUN 13 10  CREATININE 0.45 0.44  CALCIUM 8.8* 8.5*  MG  --  1.7   GFR: Estimated Creatinine Clearance: 32.5 mL/min (by C-G formula based on SCr of 0.44 mg/dL). Liver Function Tests:  No results for input(s): AST, ALT, ALKPHOS, BILITOT, PROT, ALBUMIN in the last 168 hours. No results for input(s): LIPASE, AMYLASE in the last 168 hours. No results for input(s): AMMONIA in the last 168 hours. Coagulation Profile: Recent Labs  Lab 03/22/18 2102  INR 1.00   Cardiac Enzymes: No results for input(s): CKTOTAL, CKMB, CKMBINDEX, TROPONINI in the last 168 hours. BNP (last 3 results) No results for input(s): PROBNP in the last 8760 hours. HbA1C: No results for input(s): HGBA1C in the last 72 hours. CBG: No results for input(s): GLUCAP in the last 168 hours. Lipid Profile: No results for input(s): CHOL, HDL,  LDLCALC, TRIG, CHOLHDL, LDLDIRECT in the last 72 hours. Thyroid Function Tests: No results for input(s): TSH, T4TOTAL, FREET4, T3FREE, THYROIDAB in the last 72 hours. Anemia Panel: No results for input(s): VITAMINB12, FOLATE, FERRITIN, TIBC, IRON, RETICCTPCT in the last 72 hours. Sepsis Labs: No results for input(s): PROCALCITON, LATICACIDVEN in the last 168 hours.  Recent Results (from the past 240 hour(s))  MRSA PCR Screening     Status: None   Collection Time: 03/23/18  4:45 AM  Result Value Ref Range Status   MRSA by PCR NEGATIVE NEGATIVE Final    Comment:        The GeneXpert MRSA Assay (FDA approved for NASAL specimens only), is one component of a comprehensive MRSA colonization surveillance program. It is not intended to diagnose MRSA infection nor to guide or monitor treatment for MRSA infections. Performed at [facility], [place] [address], [place], [place] [address]          Radiology Studies: Dg Chest 2 View  Result Date: 03/22/2018 CLINICAL DATA:  Unwitnessed fall.  Chest pain. EXAM: CHEST - 2 VIEW COMPARISON:  Radiographs of January 22, 2018. FINDINGS: Stable cardiomediastinal silhouette. Atherosclerosis of thoracic aorta is noted. No pneumothorax is noted. No significant pleural effusion is noted. Left lung is clear. Mild right lower lobe opacity is noted concerning for atelectasis or infiltrate. Bony thorax is unremarkable. IMPRESSION: Mild right lower lobe atelectasis or infiltrate is noted. Aortic Atherosclerosis (ICD10-I70.0). Electronically Signed   By: Marijo Conception, M.D.   On: 03/22/2018 21:47   Dg C-arm 1-60 Min-no Report  Result Date: 03/23/2018 Fluoroscopy was utilized by the requesting physician.  No radiographic interpretation.   Dg Hip Operative Unilat W Or W/o Pelvis Left  Result Date: 03/23/2018 CLINICAL DATA:  ORIF left hip fracture EXAM: OPERATIVE left HIP (WITH PELVIS IF PERFORMED) 5 VIEWS TECHNIQUE: Fluoroscopic spot  image(s) were submitted for interpretation post-operatively. COMPARISON:  03/22/2018. FINDINGS: Gamma nail fixation of an intertrochanteric/basicervical femoral fracture on the left. Distal locking screw. Components appear well positioned IMPRESSION: Good appearance following ORIF of left proximal femur fracture. Electronically Signed   By: Nelson Chimes M.D.   On: 03/23/2018 10:32   Dg Hip Unilat W Or Wo Pelvis 2-3 Views Left  Result Date: 03/22/2018 CLINICAL DATA:  Left hip pain after unwitnessed fall. EXAM: DG HIP (WITH OR WITHOUT PELVIS) 2-3V LEFT COMPARISON:  None. FINDINGS: Moderately angulated fracture is seen involving the left femoral neck. Old healed fractures of the left inferior and superior pubic rami are noted. IMPRESSION: Moderately angulated proximal left femoral neck fracture. Electronically Signed   By: Marijo Conception, M.D.   On: 03/22/2018 20:40        Scheduled Meds: . acetaminophen  500 mg Oral Q6H  . artificial tears   Both Eyes Q8H  . [START ON 03/24/2018] aspirin EC  325 mg Oral Q breakfast  . docusate sodium  100 mg Oral BID  . fentaNYL      . [START ON 03/24/2018] ferrous sulfate  325 mg Oral Q breakfast  . LORazepam  0.5 mg Oral Daily  . potassium chloride  10 mEq Oral BID  . [START ON 03/24/2018] sodium chloride  1 g Oral Daily   Continuous Infusions: .  ceFAZolin (ANCEF) IV    . dextrose 5 % and 0.45% NaCl    . methocarbamol (ROBAXIN) IV       LOS: 1 day    Time spent: 35 minutes.     Elmarie Shiley, MD [facility] Pager [phone]  If 7PM-7AM, please contact night-coverage [email or web address] Password [email or web address] 03/23/2018, 1:28 PM

## 2018-03-23 NOTE — Op Note (Signed)
NAMEOCEANA, WALTHALL MEDICAL RECORD GO:[id number] ACCOUNT [id number] DATE OF BIRTH:1931/03/21 FACILITY: [facility] LOCATION: [facility] PHYSICIAN:Melquan Ernsberger L. Cobin Cadavid, MD  OPERATIVE REPORT  DATE OF PROCEDURE:  03/23/2018  PREOPERATIVE DIAGNOSIS:  Basicervical hip fracture, left.  POSTOPERATIVE DIAGNOSIS:  Basicervical hip fracture, left.  PROCEDURES:   1.  Intramedullary rod fixation of intertrochanteric hip fracture, left. 2.  Interpretation of multiple intraoperative fluoroscopic images.   SURGEON:  Dorna Leitz, MD  ASSISTANT: Gaspar Skeeters, PA-C Gary Fleet PA-C, was present for the entire case and assisted by helping to drill, ream and to place the lag screws and closed to minimize OR time.  ANESTHESIA:  General.  BRIEF HISTORY:  The patient is an 84 year old female who fell yesterday.  She was brought to the emergency room where she was noted to have a basicervical femoral neck fracture.  We had a long discussion of treatment options and felt that open reduction  internal fixation was the appropriate course of action.  She was brought to the operating room for this procedure.  DESCRIPTION OF PROCEDURE:  The patient was taken to the operating room after adequate anesthesia was obtained with general anesthetic.  The patient was placed supine on the operating table.  She was then placed into the Hana bed with tall bony  prominences well padded.  Attention turned to the left hip where after routine prep and drape, a small incision was made just proximal to the trochanter.  A curved awl was used to get a starting point and a guidewire was advanced down the femur.  An  introductory hole was then made and a guidewire was passed down the femur.  We then passed a 12-1/2 reamer which got some mild chatter at 13 reamer and then ultimately opened an 11 mm rod x 36, which was the measurement, 36 cm, and this was advanced down  to the appropriate position.  A guidewire was advanced into the center-center  position of the head and this was locked down.  We put the derotational wire in and then drilled the head to 90 length.  A 90 was used.  Lag screw was placed and this was  locked in place.  Attention was then turned distal where a freehand interlocking screw was used.  At this point, the wounds were irrigated and suctioned dry, closed in layers.  Sterile compressive dressing was applied.  The patient was taken to the  recovery room.  She was noted to be in satisfactory condition.    Estimated blood loss for procedure was 150, but the final can be gotten from the anesthetic record.    Of note, multiple intraoperative fluoroscopic images were taken and interpreted to assess the position of the rod and screw lengths.  AN/NUANCE  D:03/23/2018 T:03/23/2018 JOB:[id number]

## 2018-03-23 NOTE — Anesthesia Preprocedure Evaluation (Signed)
Anesthesia Evaluation  Patient identified by MRN, date of birth, ID band Patient awake    Reviewed: Allergy & Precautions, H&P , NPO status , Patient's Chart, lab work & pertinent test results  Airway Mallampati: II   Neck ROM: full    Dental   Pulmonary neg pulmonary ROS,    breath sounds clear to auscultation       Cardiovascular hypertension,  Rhythm:regular Rate:Normal     Neuro/Psych PSYCHIATRIC DISORDERS Anxiety    GI/Hepatic   Endo/Other    Renal/GU      Musculoskeletal Left hip fx   Abdominal   Peds  Hematology   Anesthesia Other Findings   Reproductive/Obstetrics                             Anesthesia Physical Anesthesia Plan  ASA: II  Anesthesia Plan: General   Post-op Pain Management:    Induction: Intravenous  PONV Risk Score and Plan: 3 and Dexamethasone, Ondansetron and Treatment may vary due to age or medical condition  Airway Management Planned: Oral ETT  Additional Equipment:   Intra-op Plan:   Post-operative Plan: Extubation in OR  Informed Consent: I have reviewed the patients History and Physical, chart, labs and discussed the procedure including the risks, benefits and alternatives for the proposed anesthesia with the patient or authorized representative who has indicated his/her understanding and acceptance.     Plan Discussed with: CRNA, Anesthesiologist and Surgeon  Anesthesia Plan Comments:         Anesthesia Quick Evaluation

## 2018-03-23 NOTE — Anesthesia Procedure Notes (Signed)
Procedure Name: Intubation Date/Time: 03/23/2018 7:37 AM Performed by: Graceanna Theissen D, CRNA Pre-anesthesia Checklist: Patient identified, Emergency Drugs available, Suction available and Patient being monitored Patient Re-evaluated:Patient Re-evaluated prior to induction Oxygen Delivery Method: Circle system utilized Preoxygenation: Pre-oxygenation with 100% oxygen Induction Type: IV induction Ventilation: Mask ventilation without difficulty Laryngoscope Size: Mac and 4 Grade View: Grade I Tube type: Oral Tube size: 7.5 mm Number of attempts: 1 Airway Equipment and Method: Stylet Placement Confirmation: ETT inserted through vocal cords under direct vision,  positive ETCO2 and breath sounds checked- equal and bilateral Secured at: 21 cm Tube secured with: Tape Dental Injury: Teeth and Oropharynx as per pre-operative assessment

## 2018-03-24 ENCOUNTER — Encounter (HOSPITAL_COMMUNITY): Payer: Self-pay | Admitting: Orthopedic Surgery

## 2018-03-24 ENCOUNTER — Inpatient Hospital Stay (HOSPITAL_COMMUNITY): Payer: Medicare Other

## 2018-03-24 LAB — BASIC METABOLIC PANEL
ANION GAP: 8 (ref 5–15)
BUN: 18 mg/dL (ref 8–23)
CO2: 26 mmol/L (ref 22–32)
Calcium: 8.9 mg/dL (ref 8.9–10.3)
Chloride: 100 mmol/L (ref 98–111)
Creatinine, Ser: 0.58 mg/dL (ref 0.44–1.00)
GFR calc Af Amer: >60 mL/min (ref >60)
GFR calc non Af Amer: >60 mL/min (ref >60)
GLUCOSE: 126 mg/dL — AB (ref 70–99)
POTASSIUM: 4.6 mmol/L (ref 3.5–5.1)
Sodium: 134 mmol/L — ABNORMAL LOW (ref 135–145)

## 2018-03-24 LAB — CBC
HCT: 29.9 % — ABNORMAL LOW (ref 36.0–46.0)
Hemoglobin: 9.7 g/dL — ABNORMAL LOW (ref 12.0–15.0)
MCH: 32.3 pg (ref 26.0–34.0)
MCHC: 32.4 g/dL (ref 30.0–36.0)
MCV: 99.7 fL (ref 80.0–100.0)
NRBC: 0 % (ref 0.0–0.2)
PLATELETS: 177 10*3/uL (ref 150–400)
RBC: 3 MIL/uL — ABNORMAL LOW (ref 3.87–5.11)
RDW: 13.2 % (ref 11.5–15.5)
WBC: 13.5 10*3/uL — AB (ref 4.0–10.5)

## 2018-03-24 MED ORDER — LIP MEDEX EX OINT
TOPICAL_OINTMENT | CUTANEOUS | Status: AC
  Administered 2018-03-24: 12:00:00
  Filled 2018-03-24: qty 7

## 2018-03-24 MED ORDER — ORAL CARE MOUTH RINSE
15.0000 mL | Freq: Two times a day (BID) | OROMUCOSAL | Status: DC
  Administered 2018-03-24 – 2018-03-25 (×3): 15 mL via OROMUCOSAL

## 2018-03-24 MED ORDER — POTASSIUM CHLORIDE CRYS ER 10 MEQ PO TBCR
10.0000 meq | EXTENDED_RELEASE_TABLET | Freq: Every day | ORAL | Status: DC
  Administered 2018-03-25 – 2018-03-26 (×2): 10 meq via ORAL
  Filled 2018-03-24 (×2): qty 1

## 2018-03-24 MED ORDER — SODIUM CHLORIDE 0.9 % IV SOLN
1.5000 g | Freq: Three times a day (TID) | INTRAVENOUS | Status: DC
  Administered 2018-03-24 – 2018-03-26 (×6): 1.5 g via INTRAVENOUS
  Filled 2018-03-24 (×8): qty 1.5

## 2018-03-24 MED ORDER — SODIUM CHLORIDE 0.9 % IV SOLN
INTRAVENOUS | Status: DC | PRN
  Administered 2018-03-24: 11:00:00 via INTRAVENOUS

## 2018-03-24 MED ORDER — BISACODYL 10 MG RE SUPP: 10.0000 mg | Freq: Every day | RECTAL | Status: DC | PRN

## 2018-03-24 MED ORDER — BOOST / RESOURCE BREEZE PO LIQD CUSTOM
1.0000 | Freq: Three times a day (TID) | ORAL | Status: DC
  Administered 2018-03-24 – 2018-03-26 (×5): 1 via ORAL

## 2018-03-24 MED ORDER — LORAZEPAM 0.5 MG PO TABS: 0.5000 mg | ORAL_TABLET | Freq: Every day | ORAL | Status: DC

## 2018-03-24 MED ORDER — SODIUM CHLORIDE 1 G PO TABS
1.0000 g | ORAL_TABLET | Freq: Two times a day (BID) | ORAL | Status: DC
  Administered 2018-03-24 – 2018-03-26 (×4): 1 g via ORAL
  Filled 2018-03-24 (×5): qty 1

## 2018-03-24 MED ORDER — BISACODYL 10 MG RE SUPP
10.0000 mg | Freq: Once | RECTAL | Status: AC
  Administered 2018-03-24: 10 mg via RECTAL
  Filled 2018-03-24: qty 1

## 2018-03-24 MED ORDER — FLEET ENEMA 7-19 GM/118ML RE ENEM: 1.0000 | ENEMA | Freq: Every day | RECTAL | Status: DC | PRN

## 2018-03-24 NOTE — NC FL2 (Addendum)
[place] LEVEL OF CARE SCREENING TOOL     IDENTIFICATION  Patient Name: Julie [name] Birthdate: [birth year] Sex: female Admission Date (Current Location): 03/22/2018  [facility] and [place] Number:  Herbalist and Address:  [facility],  [place] [address] [place]      Provider Number: [id number]  Attending Physician Name and Address:  Elmarie Shiley, MD  Relative Name and Phone Number:       Current Level of Care: Hospital Recommended Level of Care: [place] Prior Approval Number:    Date Approved/Denied:   PASRR Number: [id number]  Discharge Plan: SNF    Current Diagnoses: Patient Active Problem List   Diagnosis Date Noted  . Closed left hip fracture, initial encounter ([place]) 03/22/2018  . Hip fracture ([place]) 03/22/2018  . Protein-calorie malnutrition, severe 01/23/2018  . Anxiety 01/22/2018  . Mass of breast, left 01/22/2018  . Hyponatremia 01/22/2018  . Cognitive decline 01/22/2018  . Opacity of lung on imaging study 01/22/2018  . Anorexia 01/22/2018  . Weight loss 01/22/2018  . Hypokalemia 01/22/2018  . HTN (hypertension) 07/19/2011    Orientation RESPIRATION BLADDER Height & Weight     Self, Place  Normal Continent Weight: 88 lb 2.9 oz (40 kg) Height:  5\' 4"  (162.6 cm)  BEHAVIORAL SYMPTOMS/MOOD NEUROLOGICAL BOWEL NUTRITION STATUS      Continent Diet(Regular )  AMBULATORY STATUS COMMUNICATION OF NEEDS Skin   Extensive Assist Verbally Surgical wounds- Left Hip                        Personal Care Assistance Level of Assistance  Bathing, Feeding, Dressing Bathing Assistance: maximum assistance Feeding assistance: Independent Dressing Assistance: maximum assistance     Functional Limitations Info  Sight, Hearing, Speech Sight Info: Adequate Hearing Info: Adequate Speech Info: Adequate    SPECIAL CARE FACTORS FREQUENCY  PT (By licensed PT), OT (By licensed OT)     PT Frequency:  5X/WEEK OT Frequency: 5X/WEEK            Contractures Contractures Info: Not present    Additional Factors Info  Code Status, Allergies, Psychotropic Code Status Info: DNR  Allergies Info: Allergies: Nitrofurantoin Monohyd Macro           Current Medications (03/24/2018):  This is the current hospital active medication list Current Facility-Administered Medications  Medication Dose Route Frequency Provider Last Rate Last Dose  . 0.9 %  sodium chloride infusion   Intravenous PRN Regalado, Belkys A, MD 50 mL/hr at 03/24/18 1113    . acetaminophen (TYLENOL) tablet 325-650 mg  325-650 mg Oral Q6H PRN Gary Fleet, PA-C      . alum & mag hydroxide-simeth (MAALOX/MYLANTA) 200-200-20 MG/5ML suspension 30 mL  30 mL Oral Q4H PRN Gary Fleet, PA-C      . ampicillin-sulbactam (UNASYN) 1.5 g in sodium chloride 0.9 % 100 mL IVPB  1.5 g Intravenous Q8H BellSharyn Lull T, RPH 200 mL/hr at 03/24/18 1141 1.5 g at 03/24/18 1141  . artificial tears (LACRILUBE) ophthalmic ointment   Both Eyes Q8H Gary Fleet, PA-C   Stopped at 03/24/18 0654  . aspirin EC tablet 325 mg  325 mg Oral Q breakfast Gary Fleet, PA-C   325 mg at 03/24/18 0810  . bisacodyl (DULCOLAX) EC tablet 5 mg  5 mg Oral Daily PRN Gary Fleet, PA-C      . bisacodyl (DULCOLAX) suppository 10 mg  10 mg Rectal Daily PRN Regalado,  Belkys A, MD      . docusate sodium (COLACE) capsule 100 mg  100 mg Oral BID Gary Fleet, PA-C   100 mg at 03/24/18 0810  . feeding supplement (ENSURE ENLIVE) (ENSURE ENLIVE) liquid 237 mL  237 mL Oral TID BM Regalado, Belkys A, MD   237 mL at 03/24/18 0810  . feeding supplement (PRO-STAT SUGAR FREE 64) liquid 30 mL  30 mL Oral BID Regalado, Belkys A, MD   30 mL at 03/24/18 0810  . ferrous sulfate tablet 325 mg  325 mg Oral Q breakfast Gary Fleet, PA-C   325 mg at 03/24/18 0809  . hydrALAZINE (APRESOLINE) injection 5 mg  5 mg Intravenous Q4H PRN Gary Fleet, PA-C   5 mg at 03/23/18 0059  .  HYDROcodone-acetaminophen (NORCO/VICODIN) 5-325 MG per tablet 1 tablet  1 tablet Oral Q6H PRN Gary Fleet, PA-C   1 tablet at 03/24/18 0810  . LORazepam (ATIVAN) tablet 0.5 mg  0.5 mg Oral QAC supper Regalado, Belkys A, MD   0.5 mg at 03/23/18 1628  . magnesium citrate solution 1 Bottle  1 Bottle Oral Once PRN Gary Fleet, PA-C      . MEDLINE mouth rinse  15 mL Mouth Rinse BID Regalado, Belkys A, MD   15 mL at 03/24/18 1142  . methocarbamol (ROBAXIN) tablet 500 mg  500 mg Oral Q6H PRN Gary Fleet, PA-C       Or  . methocarbamol (ROBAXIN) 500 mg in dextrose 5 % 50 mL IVPB  500 mg Intravenous Q6H PRN Gary Fleet, PA-C      . morphine 2 MG/ML injection 0.5 mg  0.5 mg Intravenous Q2H PRN Gary Fleet, PA-C   0.5 mg at 03/23/18 0501  . ondansetron (ZOFRAN) tablet 4 mg  4 mg Oral Q6H PRN Gary Fleet, PA-C       Or  . ondansetron [facility]) injection 4 mg  4 mg Intravenous Q6H PRN Gary Fleet, PA-C      . polyethylene glycol (MIRALAX / GLYCOLAX) packet 17 g  17 g Oral Daily PRN Gary Fleet, PA-C      . [START ON 03/25/2018] potassium chloride (K-DUR,KLOR-CON) CR tablet 10 mEq  10 mEq Oral Daily Regalado, Belkys A, MD      . simethicone (MYLICON) 40 FH/5.4TG suspension 120 mg  120 mg Oral Q8H PRN Gary Fleet, PA-C      . sodium chloride tablet 1 g  1 g Oral BID WC Regalado, Belkys A, MD      . sodium phosphate (FLEET) 7-19 GM/118ML enema 1 enema  1 enema Rectal Daily PRN Regalado, Belkys A, MD      . traMADol (ULTRAM) tablet 50 mg  50 mg Oral Q6H PRN Gary Fleet, PA-C         Discharge Medications: Please see discharge summary for a list of discharge medications.  Relevant Imaging Results:  Relevant Lab Results:   Additional Information SSN: [id number]  Lia Hopping, LCSW

## 2018-03-24 NOTE — Care Management Note (Signed)
Case Management Note  Patient Details  Name: Julie [name] MRN: [id number] Date of Birth: 02-10-1931  Subjective/Objective:   Plan for d/c to SNF, discharge planning per CSW. [phone]                 Action/Plan:   Expected Discharge Date:  03/28/18               Expected Discharge Plan:  Skilled Nursing Facility  In-House Referral:  Clinical Social Work  Discharge planning Services  CM Consult  Post Acute Care Choice:  NA Choice offered to:  Patient  DME Arranged:  N/A DME Agency:  NA  HH Arranged:  NA HH Agency:  NA  Status of Service:  Completed, signed off  If discussed at [employer] of Stay Meetings, dates discussed:    Additional Comments:  Guadalupe Maple, RN 03/24/2018, 8:54 AM

## 2018-03-24 NOTE — Clinical Social Work Note (Signed)
Clinical Social Work Assessment  Patient Details  Name: Julie [name] MRN: [id number] Date of Birth: 1930-05-16  Date of referral:  03/24/18               Reason for consult:  Discharge Planning                Permission sought to share information with:  Family Supports, Customer service manager Permission granted to share information::     Name::      [place] Advice worker::  [employer].   Relationship::   Daughters  Contact Information:    [phone]  Housing/Transportation Living arrangements for the past 2 months:  [place] of Information:  Adult Children Patient Interpreter Needed:  None Criminal Activity/Legal Involvement Pertinent to Current Situation/Hospitalization:  No - Comment as needed Significant Relationships:  Adult Children Lives with:  Facility Resident Do you feel safe going back to the place where you live?  Yes Need for family participation in patient care:  yes  Care giving concerns:  Patient had fall at SNF. Patient x-ray revealed left hip fracture.  Per daughter, the patient is a longterm care resident at [facility] and will return at discharge.   Social Worker assessment / plan: CSW discussed the plan with daughter. CSW confirmed the facility will accept patient at discharge.   FL2 complete.   Plan: SNF placement for rehab.   Employment status:  Retired Nurse, adult PT Recommendations:  [place] / Referral to community resources:  [place]  Patient/Family's Response to care: Agreeable and Responding to care.   Patient/Family's Understanding of and Emotional Response to Diagnosis, Current Treatment, and Prognosis:  Patient has limited understanding of her overall care. Patient daughter next of kin, and very involved in patient care. She has requested t updates about the patient current treatment plan and discharge needs.    Emotional Assessment Appearance:  Appears stated age Attitude/Demeanor/Rapport:    Affect (typically observed):    Orientation:  Oriented to Self Alcohol / Substance use:  Not Applicable Psych involvement (Current and /or in the community):  No (Comment)  Discharge Needs  Concerns to be addressed:  Discharge Planning Concerns Readmission within the last 30 days:  No Current discharge risk:  Dependent with Mobility, Physical Impairment Barriers to Discharge:  Continued Medical Work up, [place], LCSW 03/24/2018, 4:28 PM

## 2018-03-24 NOTE — Evaluation (Addendum)
Occupational Therapy Evaluation Patient Details Name: Julie [name] MRN: [id number] DOB: [birth year] Today's Date: 03/24/2018    History of Present Illness Samya Siciliano is a 84 y.o. female PHMx: HTN who was recently admitted for severe hyponatremia at this time patient also was found to have paroxysmal Afib eventually discharged to rehab.  Patient while walking unassisted slipped and fell and hurt her left hip--Left femoral neck fracture s/p ORIF   Clinical Impression   This 84 yo female admitted and underwent above presents to acute OT with increased pain despite pre-medicated, decreased mobility due to pain and O2 low without supplemental O2 (90% RA) thus affecting her safety and independence with basic ADLs. She will benefit from acute OT with follow up on OT at SNF.     Follow Up Recommendations  SNF;Supervision/Assistance - 24 hour    Equipment Recommendations  None recommended by OT       Precautions / Restrictions Precautions Precautions: Fall Restrictions Weight Bearing Restrictions: No LLE Weight Bearing: Weight bearing as tolerated      Mobility Bed Mobility Overal bed mobility: Needs Assistance Bed Mobility: Supine to Sit     Supine to sit: Total assist;+2 for physical assistance;HOB elevated     General bed mobility comments: use of pad to spin pt around  Transfers Overall transfer level: Needs assistance Equipment used: Rolling walker (2 wheeled) Transfers: Sit to/from [employer] Sit to Stand: Mod assist;+2 physical assistance Stand pivot transfers: Mod assist;+2 physical assistance            Balance Overall balance assessment: Needs assistance Sitting-balance support: Feet supported;No upper extremity supported Sitting balance-Leahy Scale: Fair     Standing balance support: Bilateral upper extremity supported Standing balance-Leahy Scale: Poor Standing balance comment: plus additional external support                            ADL either performed or assessed with clinical judgement   ADL Overall ADL's : Needs assistance/impaired Eating/Feeding: Moderate assistance Eating/Feeding Details (indicate cue type and reason): supported sitting; Dtr requested she have A with meals--made RN aware Grooming: Moderate assistance Grooming Details (indicate cue type and reason): supported sitting Upper Body Bathing: Moderate assistance Upper Body Bathing Details (indicate cue type and reason): supported sitting Lower Body Bathing: Total assistance Lower Body Bathing Details (indicate cue type and reason): Mod A +2 sit<>stand Upper Body Dressing : Maximal assistance Upper Body Dressing Details (indicate cue type and reason): supported sitting Lower Body Dressing: Total assistance Lower Body Dressing Details (indicate cue type and reason): Mod A +2 sit<>stand Toilet Transfer: Moderate assistance;+2 for physical assistance;Stand-pivot;RW Toilet Transfer Details (indicate cue type and reason): bed>recliner Toileting- Clothing Manipulation and Hygiene: Total assistance Toileting - Clothing Manipulation Details (indicate cue type and reason): Mod A +2 sit<>stand             Vision Patient Visual Report: No change from baseline              Pertinent Vitals/Pain Pain Assessment: Faces Faces Pain Scale: Hurts whole lot Pain Descriptors / Indicators: Grimacing;Guarding;Moaning Pain Intervention(s): Monitored during session;Premedicated before session     Hand Dominance Right   Extremity/Trunk Assessment Upper Extremity Assessment Upper Extremity Assessment: Generalized weakness           Communication Communication Communication: No difficulties   Cognition Arousal/Alertness: Awake/alert Behavior During Therapy: WFL for tasks assessed/performed Overall Cognitive Status: Within Functional Limits for tasks assessed  Home Living  Family/patient expects to be discharged to:: Skilled nursing facility                                        Prior Functioning/Environment Level of Independence: Needs assistance  Gait / Transfers Assistance Needed: walked with A with RW at SNF ADL's / Homemaking Assistance Needed: A with basic ADLs at SNF            OT Problem List: Decreased strength;Decreased range of motion;Impaired balance (sitting and/or standing);Pain;Decreased knowledge of use of DME or AE      OT Treatment/Interventions: Self-care/ADL training;Balance training;Therapeutic activities;DME and/or AE instruction;Patient/family education    OT Goals(Current goals can be found in the care plan section) Acute Rehab OT Goals Patient Stated Goal: back to [facility] OT Goal Formulation: With patient/family Time For Goal Achievement: 04/07/18 Potential to Achieve Goals: Good ADL Goals Pt Will Perform Grooming: with min assist;sitting(EOB) Pt Will Perform Upper Body Bathing: with min assist;sitting(EOB) Pt Will Perform Lower Body Bathing: with mod assist(min A sit<>stand) Pt Will Perform Upper Body Dressing: with min assist(sitting EOB) Pt Will Perform Lower Body Dressing: with mod assist(min A sit<>stand) Pt Will Transfer to Toilet: with min assist;stand pivot transfer;bedside commode Pt Will Perform Toileting - Clothing Manipulation and hygiene: with min assist;sit to/from stand Additional ADL Goal #1: pt will be min A in and OOB for basic ADLs with HOB up and use of rail  OT Frequency: Min 2X/week              AM-PAC PT "6 Clicks" Daily Activity     Outcome Measure Help from another person eating meals?: A Lot Help from another person taking care of personal grooming?: A Lot Help from another person toileting, which includes using toliet, bedpan, or urinal?: A Lot Help from another person bathing (including washing, rinsing, drying)?: A Lot Help from another person to put on and taking off  regular upper body clothing?: A Lot Help from another person to put on and taking off regular lower body clothing?: Total 6 Click Score: 11   End of Session Equipment Utilized During Treatment: Gait belt;Rolling walker;Other (comment)(O2 2 liters) Nurse Communication: Mobility status  Activity Tolerance: Patient tolerated treatment well Patient left: in chair;with call bell/phone within reach;with chair alarm set;with family/visitor present  OT Visit Diagnosis: Unsteadiness on feet (R26.81);Other abnormalities of gait and mobility (R26.89);History of falling (Z91.81);Pain Pain - Right/Left: Left Pain - part of body: Leg                Time: 0973-5329 OT Time Calculation (min): 36 min Charges:  OT General Charges $OT Visit: 1 Visit OT Evaluation $OT Eval Moderate Complexity: 1 Mod OT Treatments $Self Care/Home Management : 8-22 mins  [place], OTR/L Acute [employer] Pager [phone] Office [phone]     Almon Register 03/24/2018, 12:03 PM

## 2018-03-24 NOTE — Progress Notes (Signed)
Subjective: 1 Day Post-Op Procedure(s) (LRB): INTRAMEDULLARY (IM) NAIL INTERTROCHANTRIC (Left) Patient reports pain as mild. Patient sitting in chair at bedside. Some constipation. No significant complaints of left hip pain. No dizziness. grandson In room. Patient seen at noon today.   Objective: Vital signs in last 24 hours: Temp:  [97.3 F (36.3 C)-98 F (36.7 C)] 97.5 F (36.4 C) (11/11 1337) Pulse Rate:  [84-113] 86 (11/11 1337) Resp:  [16-20] 20 (11/11 1337) BP: (124-156)/(56-81) 135/56 (11/11 1337) SpO2:  [95 %-100 %] 95 % (11/11 1337)  Intake/Output from previous day: 11/10 0701 - 11/11 0700 In: 1860 [P.O.:360; I.V.:1100; IV Piggyback:400] Out: 1235 [Urine:1160; Blood:75] Intake/Output this shift: Total I/O In: 578.7 [P.O.:480; IV Piggyback:98.7] Out: 250 [Urine:250]  Recent Labs    03/22/18 2102 03/23/18 0413 03/24/18 0438  HGB 12.7 13.1 9.7*   Recent Labs    03/23/18 0413 03/24/18 0438  WBC 11.4* 13.5*  RBC 4.01 3.00*  HCT 40.2 29.9*  PLT 206 177   Recent Labs    03/23/18 0413 03/24/18 0438  NA 134* 134*  K 3.6 4.6  CL 99 100  CO2 24 26  BUN 10 18  CREATININE 0.44 0.58  GLUCOSE 111* 126*  CALCIUM 8.5* 8.9   Recent Labs    03/22/18 2102  INR 1.00   Left hip exam: Neurovascular intact Sensation intact distally Intact pulses distally Dorsiflexion/Plantar flexion intact Incision: scant drainage Compartment soft Patient alert and oriented.   Assessment/Plan: 1 Day Post-Op Procedure(s) (LRB): INTRAMEDULLARY (IM) NAIL INTERTROCHANTRIC (Left)  Plan: Out of bed with physical therapy weightbearing as tolerated on left. Aspirin 25 mg once daily for DVT prophylaxis along with SCD hose. Hopefully can get her back to skilled nursing facility in a day or so. Will follow.    Erlene Senters 03/24/2018, 2:26 PM

## 2018-03-24 NOTE — Progress Notes (Signed)
Pharmacy Antibiotic Note  Julie [name] is a 84 y.o. female admitted on 03/22/2018 withLeft femoral neck fracture s/p ORIF .  Pharmacy has been consulted for Unasyn dosing for PNA.  S/p 3 doses of ancef 2 gm for surgical prophylaxis.   Plan: Unasyn 1.5 gm IV q8h Pharmacy to sign off  Height: 5\' 4"  (162.6 cm) Weight: 88 lb 2.9 oz (40 kg) IBW/kg (Calculated) : 54.7  Temp (24hrs), Avg:97.7 F (36.5 C), Min:97.3 F (36.3 C), Max:98 F (36.7 C)  Recent Labs  Lab 03/22/18 2102 03/23/18 0413 03/24/18 0438  WBC 6.9 11.4* 13.5*  CREATININE 0.45 0.44 0.58    Estimated Creatinine Clearance: 32.5 mL/min (by C-G formula based on SCr of 0.58 mg/dL).    Allergies  Allergen Reactions  . Nitrofurantoin Monohyd Macro Nausea Only    Antimicrobials this admission: 11/10 Ancef 2 gm q8 x 3 doses> surgical px 11/11 Unasyn>> Dose adjustments this admission:  Microbiology results: 11/10 MRSA PCR neg  Thank you for allowing pharmacy to be a part of this patient's care.  Eudelia Bunch, Pharm.D [phone] 03/24/2018 10:47 AM

## 2018-03-24 NOTE — Evaluation (Signed)
Physical Therapy Evaluation Patient Details Name: Julie [name] MRN: [id number] DOB: [birth year] Today's Date: 03/24/2018   History of Present Illness  Pt is an 84 y.o. female with PHMx: HTN who was recently admitted for severe hyponatremia at this time patient also was found to have paroxysmal Afib eventually discharged to rehab.  Patient while walking unassisted slipped and fell and hurt her left hip, sustained Left femoral neck fracture s/p ORIF  Clinical Impression  Patient is s/p above surgery resulting in functional limitations due to the deficits listed below (see PT Problem List).  Patient will benefit from skilled PT to increase their independence and safety with mobility to allow discharge to the venue listed below.  Pt up in recliner on arrival and agreeable to attempt ambulation.  Pt assisted with taking a few steps and currently requiring at least mod assist for mobility.  Pt limited by fatigue and L LE pain.  Pt to d/c back to SNF.     Follow Up Recommendations SNF    Equipment Recommendations  None recommended by PT    Recommendations for Other Services       Precautions / Restrictions Precautions Precautions: Fall Restrictions Weight Bearing Restrictions: No LLE Weight Bearing: Weight bearing as tolerated      Mobility  Bed Mobility Overal bed mobility: Needs Assistance Bed Mobility: Supine to Sit     Supine to sit: Total assist;+2 for physical assistance;HOB elevated     General bed mobility comments: pt up in recliner  Transfers Overall transfer level: Needs assistance Equipment used: Rolling walker (2 wheeled) Transfers: Sit to/from Stand Sit to Stand: +2 physical assistance;Mod assist Stand pivot transfers: Mod assist;+2 physical assistance       General transfer comment: verbal cues for UE and LE positioning, assist to rise and steady as well as control descent  Ambulation/Gait Ambulation/Gait assistance: Mod assist;Min assist;+2 physical  assistance Gait Distance (Feet): 4 Feet Assistive device: Rolling walker (2 wheeled) Gait Pattern/deviations: Step-to pattern;Decreased stance time - left;Antalgic     General Gait Details: verbal cues for sequence, step length, weight bearing on RW for pain control, pt took a few steps forward with recliner following, fatigued quickly  Stairs            Wheelchair Mobility    Modified Rankin (Stroke Patients Only)       Balance Overall balance assessment: Needs assistance;History of Falls Sitting-balance support: Feet supported;No upper extremity supported Sitting balance-Leahy Scale: Fair     Standing balance support: Bilateral upper extremity supported Standing balance-Leahy Scale: Poor Standing balance comment: requires UE support and external assist                             Pertinent Vitals/Pain Pain Assessment: Faces Faces Pain Scale: Hurts even more Pain Location: L thigh/hip Pain Descriptors / Indicators: Grimacing;Guarding;Moaning Pain Intervention(s): Limited activity within patient's tolerance;Repositioned;Monitored during session;Ice applied    Home Living Family/patient expects to be discharged to:: Skilled nursing facility                      Prior Function Level of Independence: Needs assistance   Gait / Transfers Assistance Needed: walked with A with RW at SNF  ADL's / Homemaking Assistance Needed: A with basic ADLs at SNF        Hand Dominance   Dominant Hand: Right    Extremity/Trunk Assessment   Upper Extremity Assessment Upper Extremity Assessment: Generalized  weakness    Lower Extremity Assessment Lower Extremity Assessment: Generalized weakness;LLE deficits/detail LLE Deficits / Details: requiring assist due to pain, anticipated post op weakness observed LLE: Unable to fully assess due to pain       Communication   Communication: No difficulties  Cognition Arousal/Alertness: Awake/alert Behavior  During Therapy: WFL for tasks assessed/performed Overall Cognitive Status: Within Functional Limits for tasks assessed                                        General Comments      Exercises     Assessment/Plan    PT Assessment Patient needs continued PT services  PT Problem List Decreased strength;Decreased mobility;Decreased activity tolerance;Decreased balance;Decreased knowledge of use of DME;Pain       PT Treatment Interventions DME instruction;Therapeutic activities;Gait training;Therapeutic exercise;Functional mobility training;Patient/family education;Balance training    PT Goals (Current goals can be found in the Care Plan section)  Acute Rehab PT Goals Patient Stated Goal: back to [facility] PT Goal Formulation: With patient Time For Goal Achievement: 03/31/18 Potential to Achieve Goals: Good    Frequency Min 3X/week   Barriers to discharge        Co-evaluation               AM-PAC PT "6 Clicks" Daily Activity  Outcome Measure Difficulty turning over in bed (including adjusting bedclothes, sheets and blankets)?: Unable Difficulty moving from lying on back to sitting on the side of the bed? : Unable Difficulty sitting down on and standing up from a chair with arms (e.g., wheelchair, bedside commode, etc,.)?: Unable Help needed moving to and from a bed to chair (including a wheelchair)?: A Lot Help needed walking in hospital room?: A Lot Help needed climbing 3-5 steps with a railing? : Total 6 Click Score: 8    End of Session Equipment Utilized During Treatment: Gait belt Activity Tolerance: Patient limited by fatigue;Patient limited by pain Patient left: in chair;with chair alarm set;with call bell/phone within reach;with family/visitor present   PT Visit Diagnosis: Other abnormalities of gait and mobility (R26.89)    Time: 7096-2836 PT Time Calculation (min) (ACUTE ONLY): 16 min   Charges:   PT Evaluation $PT Eval Low Complexity: Bevier, PT, DPT Acute Rehabilitation Services Office: [phone] Pager: [phone]  Trena Platt 03/24/2018, 12:41 PM

## 2018-03-24 NOTE — Progress Notes (Signed)
PROGRESS NOTE    Julie [name]  ZRA:[id number] DOB: 1930/11/30 DOA: 03/22/2018 PCP: Hayden Rasmussen, MD   Brief Narrative: : Julie [name] is a 84 y.o. female with history of hypertension who was recently admitted for severe hyponatremia at this time patient also was found to have paroxysmal atrial fibrillation was not placed on anticoagulation was eventually discharged to rehab.  Patient while walking 2 days slipped and fell and hurt her hip.  Denies losing consciousness or hitting her head.  Denies chest pain or palpitation.  ED Course: In the ER x-rays revealed left hip fracture Dr. Berenice Primas on-call orthopedic surgeon has been consulted.  EKG shows normal sinus rhythm.  Chest x-ray unremarkable.  Patient admitted for further work-up including surgery.   Assessment & Plan:   Principal Problem:   Closed left hip fracture, initial encounter ([facility]) Active Problems:   HTN (hypertension)   Cognitive decline   Hip fracture (H[facility])   Left hip fracture;  OR 11-10. Intramedullary rod fixation of intertrochanteric hip fracture, left Management per ortho.  Bowel regimen.  DVT prophylaxis per ortho.   PNA; Right lower lobe infiltrate.  Patient with cough and leukocytosis.  Will start Unasyn.   Abdominal distension;  Will check KUB.  Unknown last BM.  Will order suppository  Bowel regimen.   Acute blood loss anemia;  Post surgery, expected.  Repeat hb in am. If further decrease might need blood transfusion.   Hypokalemia; replete orally Change KCL to daily.   HTN;  PRN hydralazine.   PAF;  Not on anticoagulation prior to admission.   History of hyponatremia; last admission thought to be related to diuretics and or SIADH.  On sodium tablet, continue. Patient take it BID>  Stop fluids.   Left breast mass, notice during last admission. Per documentation, family has requested not further work up/.   Underweight;  Started on ensure.   Malnutrition Type:  Nutrition Problem:  Increased nutrient needs Etiology: post-op healing, hip fracture   Malnutrition Characteristics:  Signs/Symptoms: other (comment)(underwt, low BMI, sheduled PT and rehab placement)   Nutrition Interventions:  Interventions: Ensure Enlive (each supplement provides 350kcal and 20 grams of protein), Magic cup, Prostat      DVT prophylaxis: aspirin Code Status: DNR Family Communication: grandson, was at bedside at time of my rounds.  Disposition Plan: To be determine.   Consultants:   Ortho   Procedures:   sx   Antimicrobials:  none   Subjective: Alert, family report patient has been coughing, productive.  Notice abdominal distension. Unknown last BM.  Denies abdominal pain.   Objective: Vitals:   03/23/18 1822 03/23/18 2309 03/24/18 1029 03/24/18 1337  BP: 130/81 (!) 156/73 124/61 (!) 135/56  Pulse: (!) 113 84 96 86  Resp: 16 16 16 20   Temp: (!) 97.3 F (36.3 C) 98 F (36.7 C) 97.8 F (36.6 C) (!) 97.5 F (36.4 C)  TempSrc: Oral Oral Oral Oral  SpO2: 98% 97% 100% 95%  Weight:      Height:        Intake/Output Summary (Last 24 hours) at 03/24/2018 1404 Last data filed at 03/24/2018 1234 Gross per 24 hour  Intake 1218.67 ml  Output 350 ml  Net 868.67 ml   Filed Weights   03/22/18 2021  Weight: 40 kg    Examination:  General exam: NAD Respiratory system: Crackles bases.  Cardiovascular system: S 1, S 2 RRR Gastrointestinal system: BS present, soft, nt Central nervous system: sleepy, post sx Extremities: no edema,  left hip with dressing  Skin: No rashes, lesions or ulcers    Data Reviewed: I have personally reviewed following labs and imaging studies  CBC: Recent Labs  Lab 03/22/18 2102 03/23/18 0413 03/24/18 0438  WBC 6.9 11.4* 13.5*  NEUTROABS 5.5  --   --   HGB 12.7 13.1 9.7*  HCT 38.5 40.2 29.9*  MCV 99.5 100.2* 99.7  PLT 187 206 188   Basic Metabolic Panel: Recent Labs  Lab 03/22/18 2102 03/23/18 0413 03/24/18 0438    NA 135 134* 134*  K 3.2* 3.6 4.6  CL 100 99 100  CO2 26 24 26   GLUCOSE 125* 111* 126*  BUN 13 10 18   CREATININE 0.45 0.44 0.58  CALCIUM 8.8* 8.5* 8.9  MG  --  1.7  --    GFR: Estimated Creatinine Clearance: 32.5 mL/min (by C-G formula based on SCr of 0.58 mg/dL). Liver Function Tests: No results for input(s): AST, ALT, ALKPHOS, BILITOT, PROT, ALBUMIN in the last 168 hours. No results for input(s): LIPASE, AMYLASE in the last 168 hours. No results for input(s): AMMONIA in the last 168 hours. Coagulation Profile: Recent Labs  Lab 03/22/18 2102  INR 1.00   Cardiac Enzymes: No results for input(s): CKTOTAL, CKMB, CKMBINDEX, TROPONINI in the last 168 hours. BNP (last 3 results) No results for input(s): PROBNP in the last 8760 hours. HbA1C: No results for input(s): HGBA1C in the last 72 hours. CBG: No results for input(s): GLUCAP in the last 168 hours. Lipid Profile: No results for input(s): CHOL, HDL, LDLCALC, TRIG, CHOLHDL, LDLDIRECT in the last 72 hours. Thyroid Function Tests: No results for input(s): TSH, T4TOTAL, FREET4, T3FREE, THYROIDAB in the last 72 hours. Anemia Panel: No results for input(s): VITAMINB12, FOLATE, FERRITIN, TIBC, IRON, RETICCTPCT in the last 72 hours. Sepsis Labs: No results for input(s): PROCALCITON, LATICACIDVEN in the last 168 hours.  Recent Results (from the past 240 hour(s))  MRSA PCR Screening     Status: None   Collection Time: 03/23/18  4:45 AM  Result Value Ref Range Status   MRSA by PCR NEGATIVE NEGATIVE Final    Comment:        The GeneXpert MRSA Assay (FDA approved for NASAL specimens only), is one component of a comprehensive MRSA colonization surveillance program. It is not intended to diagnose MRSA infection nor to guide or monitor treatment for MRSA infections. Performed at [facility], [place] [address], [place], [place] [address]          Radiology Studies: Dg Chest 2 View  Result Date:  03/22/2018 CLINICAL DATA:  Unwitnessed fall.  Chest pain. EXAM: CHEST - 2 VIEW COMPARISON:  Radiographs of January 22, 2018. FINDINGS: Stable cardiomediastinal silhouette. Atherosclerosis of thoracic aorta is noted. No pneumothorax is noted. No significant pleural effusion is noted. Left lung is clear. Mild right lower lobe opacity is noted concerning for atelectasis or infiltrate. Bony thorax is unremarkable. IMPRESSION: Mild right lower lobe atelectasis or infiltrate is noted. Aortic Atherosclerosis (ICD10-I70.0). Electronically Signed   By: Marijo Conception, M.D.   On: 03/22/2018 21:47   Dg Abd 1 View  Result Date: 03/24/2018 CLINICAL DATA:  Abdominal distension EXAM: ABDOMEN - 1 VIEW COMPARISON:  None. FINDINGS: Scattered large and small bowel gas is noted. Ingested tablets are noted in the left mid abdomen. Changes of prior fixation of the proximal left femur are seen. No acute bony abnormality is noted. Scoliosis in the lumbar spine is noted as well as old pubic  rami fractures. IMPRESSION: Status post left hip fixation. Nonspecific abdomen. Electronically Signed   By: Inez Catalina M.D.   On: 03/24/2018 10:30   Dg C-arm 1-60 Min-no Report  Result Date: 03/23/2018 Fluoroscopy was utilized by the requesting physician.  No radiographic interpretation.   Dg Hip Operative Unilat W Or W/o Pelvis Left  Result Date: 03/23/2018 CLINICAL DATA:  ORIF left hip fracture EXAM: OPERATIVE left HIP (WITH PELVIS IF PERFORMED) 5 VIEWS TECHNIQUE: Fluoroscopic spot image(s) were submitted for interpretation post-operatively. COMPARISON:  03/22/2018. FINDINGS: Gamma nail fixation of an intertrochanteric/basicervical femoral fracture on the left. Distal locking screw. Components appear well positioned IMPRESSION: Good appearance following ORIF of left proximal femur fracture. Electronically Signed   By: Nelson Chimes M.D.   On: 03/23/2018 10:32   Dg Hip Unilat W Or Wo Pelvis 2-3 Views Left  Result Date:  03/22/2018 CLINICAL DATA:  Left hip pain after unwitnessed fall. EXAM: DG HIP (WITH OR WITHOUT PELVIS) 2-3V LEFT COMPARISON:  None. FINDINGS: Moderately angulated fracture is seen involving the left femoral neck. Old healed fractures of the left inferior and superior pubic rami are noted. IMPRESSION: Moderately angulated proximal left femoral neck fracture. Electronically Signed   By: Marijo Conception, M.D.   On: 03/22/2018 20:40        Scheduled Meds: . artificial tears   Both Eyes Q8H  . aspirin EC  325 mg Oral Q breakfast  . docusate sodium  100 mg Oral BID  . feeding supplement (ENSURE ENLIVE)  237 mL Oral TID BM  . feeding supplement (PRO-STAT SUGAR FREE 64)  30 mL Oral BID  . ferrous sulfate  325 mg Oral Q breakfast  . LORazepam  0.5 mg Oral QAC supper  . mouth rinse  15 mL Mouth Rinse BID  . [START ON 03/25/2018] potassium chloride  10 mEq Oral Daily  . sodium chloride  1 g Oral BID WC   Continuous Infusions: . sodium chloride 50 mL/hr at 03/24/18 1113  . ampicillin-sulbactam (UNASYN) IV Stopped (03/24/18 1211)  . methocarbamol (ROBAXIN) IV       LOS: 2 days    Time spent: 35 minutes.     Elmarie Shiley, MD [facility] Pager [phone]  If 7PM-7AM, please contact night-coverage [email or web address] Password [email or web address] 03/24/2018, 2:04 PM

## 2018-03-24 NOTE — Evaluation (Signed)
Clinical/Bedside Swallow Evaluation Patient Details  Name: Julie [name] MRN: [id number] Date of Birth: [birth year]  Today's Date: 03/24/2018 Time: SLP Start Time (ACUTE ONLY): 60 SLP Stop Time (ACUTE ONLY): 1328 SLP Time Calculation (min) (ACUTE ONLY): 19 min  Past Medical History:  Past Medical History:  Diagnosis Date  . Anxiety   . Cancer ([place])   . Hypertension    Past Surgical History: History reviewed. No pertinent surgical history. HPI:  84 yo female with h/o decline in overall health recently adm with severe hyponatremia and found to paroxysmal Afib, dc'd to rehab and she fell with resultant left femoral fx s/p ORIF.  PMH + for malnutrition - pt stating she doesn't exercise much, bloating prior to admit, breast mass left sided.  Pt also with fall in September of 2019.  CXR 11/9 showed mild right LL atelectasis or infiltrate.  Intake listed as 15% and pt is afebrile.  Swallow evaluation ordered.     Assessment / Plan / Recommendation Clinical Impression  Pt with no indications of oropharyngeal dysphagia with po observed including 3 ounce water test, pineapple and magic cup boluses.  No focal CN deficits apparent either.    Daughter arrived to room stating pt has problems with bloating with poor intake even prior to admission.  Pt noted to demonstrate pursed lip breathing over the last few months since this "nightmare began" per her daughter.   She also states pt has problems with significant halitosis.  Pt denies reflux symptoms and states she is not on a reflux medication.  Daughter states pt is coughing up frothy secretions  - SLP ?s GI source.  SLP suspects she may have GI issues impacting her intake including bloating, ? constipation, halitosis.  No indication of an oropharyngeal dysphagia present.   Recommend continue diet as tolerated and consider follow up with GI if symptoms do not improve with bowel regimen.   If pt to have palliative referral, given pt's advanced age and  premorbid GI issues, would defer plan until [place] completed.  No SLP follow up indicated as education completed to lack of concerns for oropharyngeal deficits.   SLP Visit Diagnosis: Dysphagia, unspecified (R13.10)    Aspiration Risk  Mild aspiration risk    Diet Recommendation Regular;Thin liquid   Liquid Administration via: Straw;Cup Medication Administration: Whole meds with liquid Supervision: Patient able to self feed Compensations: Slow rate;Small sips/bites(small frequent meals) Postural Changes: Seated upright at 90 degrees;Remain upright for at least 30 minutes after po intake    Other  Recommendations Recommended Consults: Other (Comment)(dependent on GOC, consider GI eval) Oral Care Recommendations: Oral care BID   Follow up Recommendations None      Frequency and Duration   n/a         Prognosis   n/a     Swallow Study   General Date of Onset: 03/24/18 HPI: 84 yo female with h/o decline in overall health recently adm with severe hyponatremia and found to paroxysmal Afib, dc'd to rehab and she fell with resultant left femoral fx s/p ORIF.  PMH + for malnutrition - pt stating she doesn't exercise much, bloating prior to admit, breast mass left sided.  Pt also with fall in September of 2019.  CXR 11/9 showed mild right LL atelectasis or infiltrate.  Intake listed as 15% and pt is afebrile.  Swallow evaluation ordered.   Type of Study: Bedside Swallow Evaluation Previous Swallow Assessment: none in epic Diet Prior to this Study: Regular(vegetarian) Respiratory Status: Room air(pt  holding oxygen in her hand) Behavior/Cognition: Alert;Cooperative Oral Cavity Assessment: Within Functional Limits;Other (comment)(single small abrasion on hard palate) Oral Care Completed by SLP: No(pt had just been eating her lunch) Oral Cavity - Dentition: Dentures, top;Poor condition Vision: Functional for self-feeding Self-Feeding Abilities: (held her own cup, slp cut up pineapple for  pt) Patient Positioning: Upright in chair Baseline Vocal Quality: Normal Volitional Swallow: Able to elicit    Oral/Motor/Sensory Function Overall Oral Motor/Sensory Function: Within functional limits   Ice Chips Ice chips: Not tested   Thin Liquid Thin Liquid: Within functional limits Presentation: Cup;Self Fed;Straw Other Comments: 3 ounce water test easily passed without difficulty    Nectar Thick Nectar Thick Liquid: Not tested   Honey Thick Honey Thick Liquid: Not tested   Puree Puree: Within functional limits Presentation: Self Fed Other Comments: pt with significant displeasure with taste of Magic Cup but no dysphagia   Solid     Solid: Within functional limits Presentation: Self Fredirick Lathe 03/24/2018,2:27 PM Luanna Salk, [facility] SLP [place] Pager [phone] Office [phone]

## 2018-03-25 LAB — CBC
HCT: 25 % — ABNORMAL LOW (ref 36.0–46.0)
Hemoglobin: 8 g/dL — ABNORMAL LOW (ref 12.0–15.0)
MCH: 33.1 pg (ref 26.0–34.0)
MCHC: 32 g/dL (ref 30.0–36.0)
MCV: 103.3 fL — ABNORMAL HIGH (ref 80.0–100.0)
PLATELETS: 128 10*3/uL — AB (ref 150–400)
RBC: 2.42 MIL/uL — ABNORMAL LOW (ref 3.87–5.11)
RDW: 13.5 % (ref 11.5–15.5)
WBC: 9.7 10*3/uL (ref 4.0–10.5)
nRBC: 0 % (ref 0.0–0.2)

## 2018-03-25 LAB — BASIC METABOLIC PANEL
Anion gap: 5 (ref 5–15)
BUN: 16 mg/dL (ref 8–23)
CALCIUM: 8.1 mg/dL — AB (ref 8.9–10.3)
CO2: 26 mmol/L (ref 22–32)
CREATININE: 0.48 mg/dL (ref 0.44–1.00)
Chloride: 103 mmol/L (ref 98–111)
GFR calc Af Amer: >60 mL/min (ref >60)
Glucose, Bld: 96 mg/dL (ref 70–99)
POTASSIUM: 4 mmol/L (ref 3.5–5.1)
SODIUM: 134 mmol/L — AB (ref 135–145)

## 2018-03-25 LAB — PREPARE RBC (CROSSMATCH)

## 2018-03-25 MED ORDER — SENNOSIDES-DOCUSATE SODIUM 8.6-50 MG PO TABS
1.0000 | ORAL_TABLET | Freq: Two times a day (BID) | ORAL | Status: DC
  Administered 2018-03-25 – 2018-03-26 (×3): 1 via ORAL
  Filled 2018-03-25 (×3): qty 1

## 2018-03-25 MED ORDER — POLYETHYLENE GLYCOL 3350 17 G PO PACK
17.0000 g | PACK | Freq: Every day | ORAL | Status: DC
  Administered 2018-03-26: 17 g via ORAL
  Filled 2018-03-25: qty 1

## 2018-03-25 MED ORDER — SODIUM CHLORIDE 0.9% IV SOLUTION: Freq: Once | INTRAVENOUS | Status: DC

## 2018-03-25 NOTE — Progress Notes (Signed)
Subjective: 2 Days Post-Op Procedure(s) (LRB): INTRAMEDULLARY (IM) NAIL INTERTROCHANTRIC (Left) Patient reports pain as mild.  Patient slightly conversant today.  I do not think she is fully aware of her situation.  Objective: Vital signs in last 24 hours: Temp:  [97.5 F (36.4 C)-97.8 F (36.6 C)] 97.7 F (36.5 C) (11/11 2121) Pulse Rate:  [83-97] 83 (11/12 0453) Resp:  [16-20] 18 (11/12 0453) BP: (112-155)/(56-71) 155/71 (11/12 0453) SpO2:  [92 %-100 %] 96 % (11/12 0453)  Intake/Output from previous day: 11/11 0701 - 11/12 0700 In: 1884 [P.O.:840; I.V.:939.2; IV Piggyback:99.8] Out: 1005 [Urine:1005] Intake/Output this shift: Total I/O In: -  Out: 500 [Urine:500]  Recent Labs    03/22/18 2102 03/23/18 0413 03/24/18 0438 03/25/18 0442  HGB 12.7 13.1 9.7* 8.0*   Recent Labs    03/24/18 0438 03/25/18 0442  WBC 13.5* 9.7  RBC 3.00* 2.42*  HCT 29.9* 25.0*  PLT 177 128*   Recent Labs    03/24/18 0438 03/25/18 0442  NA 134* 134*  K 4.6 4.0  CL 100 103  CO2 26 26  BUN 18 16  CREATININE 0.58 0.48  GLUCOSE 126* 96  CALCIUM 8.9 8.1*   Recent Labs    03/22/18 2102  INR 1.00    Neurologically intact ABD soft Neurovascular intact Intact pulses distally Dorsiflexion/Plantar flexion intact No cellulitis present Compartment soft  Minimal pain with range of motion of the left hip.   Minimal soft tissue swelling.   Assessment/Plan: 2 Days Post-Op Procedure(s) (LRB): INTRAMEDULLARY (IM) NAIL INTERTROCHANTRIC (Left) Advance diet Up with therapy Discharge to SNF Follow-up will be with Sergio Hobart in 2 weeks.   Alta Corning 03/25/2018, 10:20 AM

## 2018-03-25 NOTE — Progress Notes (Signed)
Occupational Therapy Treatment Patient Details Name: Julie [name] MRN: [id number] DOB: [birth year] Today's Date: 03/25/2018    History of present illness Pt is an 84 y.o. female with PHMx: HTN who was recently admitted for severe hyponatremia at this time patient also was found to have paroxysmal Afib eventually discharged to rehab.  Patient while walking unassisted slipped and fell and hurt her left hip, sustained Left femoral neck fracture s/p ORIF   OT comments  Pt getting ready for blood; asymptomatic per nurse. Educated on and used AE from seated level for LB adls. Pt needed max A to use this. She had recently used bathroom and did not want to stand during this session. Performed seated grooming:  Min to mod A for thoroughness   Follow Up Recommendations  SNF    Equipment Recommendations  3 in 1 bedside commode    Recommendations for Other Services      Precautions / Restrictions Precautions Precautions: Fall Restrictions LLE Weight Bearing: Weight bearing as tolerated       Mobility Bed Mobility               General bed mobility comments: oob  Transfers                 General transfer comment: NT:  pt had recently used bathroom; did not want to stand up for pressure relief    Balance                                           ADL either performed or assessed with clinical judgement   ADL       Grooming: Wash/dry hands;Wash/dry face;Minimal assistance;Moderate assistance;Brushing hair;Sitting                                 General ADL Comments: introduced AE and educated on using it for LB adls:  max A to use reacher and sock aide     Vision       Perception     Praxis      Cognition Arousal/Alertness: Awake/alert Behavior During Therapy: WFL for tasks assessed/performed Overall Cognitive Status: No family/caregiver present to determine baseline cognitive functioning                                  General Comments: had difficulty with new learning with AE.  Unaware that she was getting blood        Exercises     Shoulder Instructions       General Comments      Pertinent Vitals/ Pain       Pain Assessment: Faces Faces Pain Scale: Hurts little more Pain Location: L thigh/hip with movement Pain Descriptors / Indicators: Grimacing;Guarding Pain Intervention(s): Limited activity within patient's tolerance;Monitored during session  Home Living                                          Prior Functioning/Environment              Frequency  Min 2X/week        Progress Toward Goals  OT Goals(current goals can now be found in  the care plan section)  Progress towards OT goals: Progressing toward goals  Acute Rehab OT Goals Patient Stated Goal: back to [facility]  Plan      Co-evaluation                 AM-PAC PT "6 Clicks" Daily Activity     Outcome Measure   Help from another person eating meals?: A Lot Help from another person taking care of personal grooming?: A Lot Help from another person toileting, which includes using toliet, bedpan, or urinal?: A Lot Help from another person bathing (including washing, rinsing, drying)?: A Lot Help from another person to put on and taking off regular upper body clothing?: A Lot Help from another person to put on and taking off regular lower body clothing?: Total 6 Click Score: 11    End of Session    OT Visit Diagnosis: Unsteadiness on feet (R26.81);Other abnormalities of gait and mobility (R26.89);History of falling (Z91.81);Pain Pain - Right/Left: Left Pain - part of body: Leg   Activity Tolerance Patient tolerated treatment well   Patient Left in chair;with call bell/phone within reach;with chair alarm set;with nursing/sitter in room   Nurse Communication          Time: 1583-0940 OT Time Calculation (min): 21 min  Charges: OT General Charges $OT Visit: 1 Visit OT  Treatments $Self Care/Home Management : 8-22 mins  Lesle Chris, OTR/L Acute Rehabilitation Services [phone] [facility] pager [phone] office 03/25/2018   Shlonda Dolloff 03/25/2018, 3:04 PM

## 2018-03-25 NOTE — Plan of Care (Signed)
  Problem: Education: Goal: Verbalization of understanding the information provided (i.e., activity precautions, restrictions, etc) will improve Outcome: Progressing Goal: Individualized Educational Video(s) Outcome: Progressing   Problem: Activity: Goal: Ability to ambulate and perform ADLs will improve Outcome: Progressing   Problem: Clinical Measurements: Goal: Postoperative complications will be avoided or minimized Outcome: Progressing   Problem: Self-Concept: Goal: Ability to maintain and perform role responsibilities to the fullest extent possible will improve Outcome: Progressing   Problem: Pain Management: Goal: Pain level will decrease Outcome: Progressing   Problem: Health Behavior/Discharge Planning: Goal: Ability to manage health-related needs will improve Outcome: Progressing   Problem: Clinical Measurements: Goal: Will remain free from infection Outcome: Progressing Goal: Diagnostic test results will improve Outcome: Progressing Goal: Cardiovascular complication will be avoided Outcome: Progressing   Problem: Activity: Goal: Risk for activity intolerance will decrease Outcome: Progressing   Problem: Nutrition: Goal: Adequate nutrition will be maintained Outcome: Progressing   Problem: Elimination: Goal: Will not experience complications related to bowel motility Outcome: Progressing Goal: Will not experience complications related to urinary retention Outcome: Progressing   Problem: Safety: Goal: Ability to remain free from injury will improve Outcome: Progressing   Problem: Skin Integrity: Goal: Risk for impaired skin integrity will decrease Outcome: Progressing

## 2018-03-25 NOTE — Discharge Instructions (Signed)
There are no restrictions to the patient's activity.  She may be weightbearing as tolerated.

## 2018-03-25 NOTE — Progress Notes (Addendum)
PROGRESS NOTE    Julie [name]  GBT:[id number] DOB: [birth year] DOA: 03/22/2018 PCP: Hayden Rasmussen, MD   Brief Narrative: : Julie [name] is a 84 y.o. female with history of hypertension who was recently admitted for severe hyponatremia at this time patient also was found to have paroxysmal atrial fibrillation was not placed on anticoagulation was eventually discharged to rehab.  Patient while walking 2 days slipped and fell and hurt her hip.  Denies losing consciousness or hitting her head.  Denies chest pain or palpitation.  ED Course: In the ER x-rays revealed left hip fracture Dr. Berenice Primas on-call orthopedic surgeon has been consulted.  EKG shows normal sinus rhythm.  Chest x-ray unremarkable.  Patient admitted for further work-up including surgery.  Admitted with left hip fracture. Found to have PNA. Also acute blood loss anemia post op. Getting one unit PRBC 11-12.  Assessment & Plan:   Principal Problem:   Closed left hip fracture, initial encounter ([place]) Active Problems:   HTN (hypertension)   Cognitive decline   Hip fracture (HCC)   Left hip fracture;  OR 11-10. Intramedullary rod fixation of intertrochanteric hip fracture, left Management per ortho.  Bowel regimen. Had BM 11-11 DVT prophylaxis per ortho.   PNA; Right lower lobe infiltrate.  Patient with cough and leukocytosis.  Continue with  Unasyn. Day 2. Treat for 5 days.   Abdominal distension;   KUB negative for obstruction.  Had BM after  suppository 11-11. Bowel regimen.   Acute blood loss anemia;  Post surgery, expected.  Hb at 8. Will transfuse one unit PRBC>   Mild thrombocytopenia; repeat labs in am.   Hypokalemia; replete orally Change KCL to daily.   HTN;  PRN hydralazine.   PAF;  Not on anticoagulation prior to admission.   History of hyponatremia; last admission thought to be related to diuretics and or SIADH.  On sodium tablet, continue. Patient take it BID>  Stop fluids.  Sodium  stable.   Acute metabolic encephalopathy, Delirium, confusion. Hospital delirium.  Frequent orientation.   Left breast mass, notice during last admission. Per documentation, family has requested not further work up/.   Underweight;  Started on ensure.   Malnutrition Type:  Nutrition Problem: Increased nutrient needs Etiology: post-op healing, hip fracture   Malnutrition Characteristics:  Signs/Symptoms: other (comment)(underwt, low BMI, sheduled PT and rehab placement)   Nutrition Interventions:  Interventions: Ensure Enlive (each supplement provides 350kcal and 20 grams of protein), Magic cup, Prostat      DVT prophylaxis: aspirin Code Status: DNR Family Communication: grandson, was at bedside at time of my rounds.  Disposition Plan: To be determine.   Consultants:   Ortho   Procedures:   sx   Antimicrobials:  none   Subjective: Sitting recliner, sleepy, would answer few questions.  Per daughter patient has been more confuse.   Objective: Vitals:   03/24/18 1029 03/24/18 1337 03/24/18 2121 03/25/18 0453  BP: 124/61 (!) 135/56 112/62 (!) 155/71  Pulse: 96 86 97 83  Resp: 16 20 20 18   Temp: 97.8 F (36.6 C) (!) 97.5 F (36.4 C) 97.7 F (36.5 C)   TempSrc: Oral Oral Oral   SpO2: 100% 95% 92% 96%  Weight:      Height:        Intake/Output Summary (Last 24 hours) at 03/25/2018 1054 Last data filed at 03/25/2018 0942 Gross per 24 hour  Intake 2315.01 ml  Output 1255 ml  Net 1060.01 ml   [employer]  03/22/18 2021  Weight: 40 kg    Examination:  General exam: NAD Respiratory system: Bilateral air movement. Crackles bases.  Cardiovascular system: S 1, S 2 RRR Gastrointestinal system: BS present, soft,  Central nervous system: sleepy, answer some questions.  Extremities: no edema, Left Hip with dressing.  Skin: No rashes, lesions or ulcers    Data Reviewed: I have personally reviewed following labs and imaging  studies  CBC: Recent Labs  Lab 03/22/18 2102 03/23/18 0413 03/24/18 0438 03/25/18 0442  WBC 6.9 11.4* 13.5* 9.7  NEUTROABS 5.5  --   --   --   HGB 12.7 13.1 9.7* 8.0*  HCT 38.5 40.2 29.9* 25.0*  MCV 99.5 100.2* 99.7 103.3*  PLT 187 206 177 073*   Basic Metabolic Panel: Recent Labs  Lab 03/22/18 2102 03/23/18 0413 03/24/18 0438 03/25/18 0442  NA 135 134* 134* 134*  K 3.2* 3.6 4.6 4.0  CL 100 99 100 103  CO2 26 24 26 26   GLUCOSE 125* 111* 126* 96  BUN 13 10 18 16   CREATININE 0.45 0.44 0.58 0.48  CALCIUM 8.8* 8.5* 8.9 8.1*  MG  --  1.7  --   --    GFR: Estimated Creatinine Clearance: 32.5 mL/min (by C-G formula based on SCr of 0.48 mg/dL). Liver Function Tests: No results for input(s): AST, ALT, ALKPHOS, BILITOT, PROT, ALBUMIN in the last 168 hours. No results for input(s): LIPASE, AMYLASE in the last 168 hours. No results for input(s): AMMONIA in the last 168 hours. Coagulation Profile: Recent Labs  Lab 03/22/18 2102  INR 1.00   Cardiac Enzymes: No results for input(s): CKTOTAL, CKMB, CKMBINDEX, TROPONINI in the last 168 hours. BNP (last 3 results) No results for input(s): PROBNP in the last 8760 hours. HbA1C: No results for input(s): HGBA1C in the last 72 hours. CBG: No results for input(s): GLUCAP in the last 168 hours. Lipid Profile: No results for input(s): CHOL, HDL, LDLCALC, TRIG, CHOLHDL, LDLDIRECT in the last 72 hours. Thyroid Function Tests: No results for input(s): TSH, T4TOTAL, FREET4, T3FREE, THYROIDAB in the last 72 hours. Anemia Panel: No results for input(s): VITAMINB12, FOLATE, FERRITIN, TIBC, IRON, RETICCTPCT in the last 72 hours. Sepsis Labs: No results for input(s): PROCALCITON, LATICACIDVEN in the last 168 hours.  Recent Results (from the past 240 hour(s))  MRSA PCR Screening     Status: None   Collection Time: 03/23/18  4:45 AM  Result Value Ref Range Status   MRSA by PCR NEGATIVE NEGATIVE Final    Comment:        The GeneXpert  MRSA Assay (FDA approved for NASAL specimens only), is one component of a comprehensive MRSA colonization surveillance program. It is not intended to diagnose MRSA infection nor to guide or monitor treatment for MRSA infections. Performed at [facility], [place] [address], [place], [place] [address]          Radiology Studies: Dg Abd 1 View  Result Date: 03/24/2018 CLINICAL DATA:  Abdominal distension EXAM: ABDOMEN - 1 VIEW COMPARISON:  None. FINDINGS: Scattered large and small bowel gas is noted. Ingested tablets are noted in the left mid abdomen. Changes of prior fixation of the proximal left femur are seen. No acute bony abnormality is noted. Scoliosis in the lumbar spine is noted as well as old pubic rami fractures. IMPRESSION: Status post left hip fixation. Nonspecific abdomen. Electronically Signed   By: Inez Catalina M.D.   On: 03/24/2018 10:30  Scheduled Meds: . artificial tears   Both Eyes Q8H  . aspirin EC  325 mg Oral Q breakfast  . docusate sodium  100 mg Oral BID  . feeding supplement  1 Container Oral TID BM  . feeding supplement (PRO-STAT SUGAR FREE 64)  30 mL Oral BID  . ferrous sulfate  325 mg Oral Q breakfast  . LORazepam  0.5 mg Oral QAC supper  . mouth rinse  15 mL Mouth Rinse BID  . potassium chloride  10 mEq Oral Daily  . sodium chloride  1 g Oral BID WC   Continuous Infusions: . sodium chloride 50 mL/hr at 03/25/18 0942  . ampicillin-sulbactam (UNASYN) IV 1.5 g (03/25/18 0442)  . methocarbamol (ROBAXIN) IV       LOS: 3 days    Time spent: 35 minutes.     Elmarie Shiley, MD [facility] Pager [phone]  If 7PM-7AM, please contact night-coverage [email or web address] Password [facility] 03/25/2018, 10:54 AM

## 2018-03-26 DIAGNOSIS — S72002A Fracture of unspecified part of neck of left femur, initial encounter for closed fracture: Secondary | ICD-10-CM

## 2018-03-26 DIAGNOSIS — R4189 Other symptoms and signs involving cognitive functions and awareness: Secondary | ICD-10-CM

## 2018-03-26 LAB — TYPE AND SCREEN
ABO/RH(D): AB POS
ANTIBODY SCREEN: NEGATIVE
Unit division: 0

## 2018-03-26 LAB — BPAM RBC
Blood Product Expiration Date: 201912062359
ISSUE DATE / TIME: 201911121437
Unit Type and Rh: 6200

## 2018-03-26 LAB — BASIC METABOLIC PANEL
ANION GAP: 10 (ref 5–15)
BUN: 18 mg/dL (ref 8–23)
CO2: 24 mmol/L (ref 22–32)
Calcium: 8.6 mg/dL — ABNORMAL LOW (ref 8.9–10.3)
Chloride: 99 mmol/L (ref 98–111)
Creatinine, Ser: 0.45 mg/dL (ref 0.44–1.00)
GFR calc Af Amer: >60 mL/min (ref >60)
GFR calc non Af Amer: >60 mL/min (ref >60)
GLUCOSE: 107 mg/dL — AB (ref 70–99)
POTASSIUM: 3.8 mmol/L (ref 3.5–5.1)
Sodium: 133 mmol/L — ABNORMAL LOW (ref 135–145)

## 2018-03-26 LAB — CBC
HCT: 34.9 % — ABNORMAL LOW (ref 36.0–46.0)
Hemoglobin: 11.5 g/dL — ABNORMAL LOW (ref 12.0–15.0)
MCH: 31.4 pg (ref 26.0–34.0)
MCHC: 33 g/dL (ref 30.0–36.0)
MCV: 95.4 fL (ref 80.0–100.0)
Platelets: 186 10*3/uL (ref 150–400)
RBC: 3.66 MIL/uL — AB (ref 3.87–5.11)
RDW: 14.6 % (ref 11.5–15.5)
WBC: 10.9 10*3/uL — ABNORMAL HIGH (ref 4.0–10.5)
nRBC: 0 % (ref 0.0–0.2)

## 2018-03-26 MED ORDER — AMOXICILLIN-POT CLAVULANATE 875-125 MG PO TABS: 1.0000 | ORAL_TABLET | Freq: Two times a day (BID) | ORAL | 0 refills | Status: AC

## 2018-03-26 MED ORDER — FERROUS SULFATE 325 (65 FE) MG PO TABS: 325.0000 mg | ORAL_TABLET | Freq: Every day | ORAL | Status: AC

## 2018-03-26 MED ORDER — SENNOSIDES-DOCUSATE SODIUM 8.6-50 MG PO TABS: 1.0000 | ORAL_TABLET | Freq: Two times a day (BID) | ORAL | Status: DC

## 2018-03-26 MED ORDER — PRO-STAT SUGAR FREE PO LIQD: 30.0000 mL | Freq: Two times a day (BID) | ORAL | 0 refills | Status: AC

## 2018-03-26 MED ORDER — ASPIRIN 325 MG PO TBEC: 325.0000 mg | DELAYED_RELEASE_TABLET | Freq: Every day | ORAL | 0 refills | Status: DC

## 2018-03-26 MED ORDER — POLYETHYLENE GLYCOL 3350 17 G PO PACK: 17.0000 g | PACK | Freq: Every day | ORAL | 0 refills | Status: DC

## 2018-03-26 NOTE — Clinical Social Work Placement (Signed)
D/C Summary.  Nurse given the number to call report.  [facility] arranged to transport.  DNR form sent w/ patient   CLINICAL SOCIAL WORK PLACEMENT  NOTE  Date:  03/26/2018  Patient Details  Name: Julie [name] MRN: [id number] Date of Birth: [birth year]  Clinical Social Work is seeking post-discharge placement for this patient at the [place] level of care (*CSW will initial, date and re-position this form in  chart as items are completed):  No   Patient/family provided with [facility] Work Department's list of facilities offering this level of care within the geographic area requested by the patient (or if unable, by the patient's family).  No   Patient/family informed of their freedom to choose among providers that offer the needed level of care, that participate in Medicare, Medicaid or managed care program needed by the patient, have an available bed and are willing to accept the patient.  No   Patient/family informed of [facility]'s ownership interest in [facility] and [facility], as well as of the fact that they are under no obligation to receive care at these facilities.  PASRR submitted to EDS on 03/26/18     PASRR number received on       Existing PASRR number confirmed on       FL2 transmitted to all facilities in geographic area requested by pt/family on       FL2 transmitted to all facilities within larger geographic area on 03/26/18     Patient informed that his/her managed care company has contracts with or will negotiate with certain facilities, including the following:  [facility], [facility]         Patient/family informed of bed offers received.  Patient chooses bed at [facility] [place]     Physician recommends and patient chooses bed at      Patient to be transferred to [facility] on 03/26/18.  Patient to be transferred to facility by [facility]     Patient family notified on 03/26/18 of  transfer.  Name of family member notified:  Daughter Ginger     PHYSICIAN       Additional Comment:    _______________________________________________ Lia Hopping, LCSW 03/26/2018, 1:27 PM

## 2018-03-26 NOTE — Progress Notes (Signed)
Spoke with Tammy,RN at [facility] and gave report. Nurse verbalizes no further questions.

## 2018-03-26 NOTE — Progress Notes (Signed)
Patient confused but cooperative overnight. Vitals stable.

## 2018-03-26 NOTE — Care Management Important Message (Signed)
Important Message  Patient Details  Name: Julie [name] MRN: [id number] Date of Birth: 07-11-1930   Medicare Important Message Given:  Yes    Kerin Salen 03/26/2018, 12:39 [place] Message  Patient Details  Name: Julie [name] MRN: [id number] Date of Birth: 02-27-1931   Medicare Important Message Given:  Yes    Kerin Salen 03/26/2018, 12:39 PM

## 2018-03-26 NOTE — Discharge Summary (Signed)
Discharge Summary  Julie [name] WFU:[id number] DOB: 11-23-1930  PCP: Hayden Rasmussen, MD  Admit date: 03/22/2018 Discharge date: 03/26/2018  Time spent: 35 mins  Recommendations for Outpatient Follow-up:  1. PCP in 1 week 2. Orthopedics in 2 weeks  Discharge Diagnoses:  Active Hospital Problems   Diagnosis Date Noted  . Closed left hip fracture, initial encounter ([place]) 03/22/2018  . Hip fracture ([place]) 03/22/2018  . Cognitive decline 01/22/2018  . HTN (hypertension) 07/19/2011    Resolved Hospital Problems  No resolved problems to display.    Discharge Condition: Stable  Diet recommendation: Heart healthy  Vitals:   03/25/18 1457 03/25/18 1824  BP: (!) 161/87 (!) 160/77  Pulse: 91 78  Resp: 17 16  Temp: 97.6 F (36.4 C) 97.8 F (36.6 C)  SpO2: 96% 97%    History of present illness:  Julie [name] a 84 y.o.femalewithhistory of hypertension who was recently admitted for severe hyponatremia, also found to have paroxysmal atrial fibrillation was not placed on anticoagulation was eventually discharged to rehab. Patient while walking, slipped and fell and hurt her hip. Denies losing consciousness or hitting her head. Denies chest pain or palpitation. In the ER x-rays revealed left hip fracture. Dr. Berenice Primas on-call orthopedic surgeon was consulted. Pt admitted for further management.   Today, pt denies any new complaints, no chest pain, abdominal pain (although still distended), fever/chills, nausea/vomiting. Pt stable to be discharged back to her SNF for further rehab.   Hospital Course:  Principal Problem:   Closed left hip fracture, initial encounter [facility]) Active Problems:   HTN (hypertension)   Cognitive decline   Hip fracture (HCC)  Left hip fracture s/p intramedullary rod fixation on 03/23/18 Management per ortho DVT prophylaxis per ortho SNF for rehab  CAP with Right lower lobe infiltrate Afebrile with resolving leukocytosis CXR with RLL  PNA S/P IV Unasyn, will switch to PO Augmentin for a total of 5 days, ending on 03/28/18  Abdominal distension, ??constipation  KUB negative for obstruction Continue bowel regimen   Acute blood loss anemia likely post op S/P 1U of PRBC on 03/25/18, hgb stable PCP to follow up with labs   PAF HR controlled Not on anticoagulation prior to admission.   History of hyponatremia Stable Last admission thought to be related to diuretics and or SIADH Continue sodium tablet  Left breast mass, noticed during last admission. Per documentation, family has requested not further work up/.   Underweight Started on supplements  Malnutrition Type:  Nutrition Problem: Increased nutrient needs Etiology: post-op healing, hip fracture   Malnutrition Characteristics:  Signs/Symptoms: other (comment)(underwt, low BMI, sheduled PT and rehab placement)   Nutrition Interventions:  Interventions: Ensure Enlive (each supplement provides 350kcal and 20 grams of protein), Magic cup, Prostat     Procedures: Intramedullary rod fixation on 03/23/18  Consultations:  Orthopedics  Discharge Exam: BP (!) 160/77 (BP Location: Right Arm)   Pulse 78   Temp 97.8 F (36.6 C) (Oral)   Resp 16   Ht 5\' 4"  (1.626 m)   Wt 40 kg   SpO2 97%   BMI 15.14 kg/m   General: NAD Cardiovascular: S1,S2 present Respiratory: CTAB GI: Abdomen soft, distended, NT, BS present  Discharge Instructions You were cared for by a hospitalist during your hospital stay. If you have any questions about your discharge medications or the care you received while you were in the hospital after you are discharged, you can call the unit and asked to speak with the hospitalist on  call if the hospitalist that took care of you is not available. Once you are discharged, your primary care physician will handle any further medical issues. Please note that NO REFILLS for any discharge medications will be authorized once  you are discharged, as it is imperative that you return to your primary care physician (or establish a relationship with a primary care physician if you do not have one) for your aftercare needs so that they can reassess your need for medications and monitor your lab values.  Discharge Instructions    Weight bearing as tolerated   Complete by:  As directed    Laterality:  left   Extremity:  Lower     Allergies as of 03/26/2018      Reactions   Nitrofurantoin Monohyd Macro Nausea Only      Medication List    TAKE these medications   acetaminophen 325 MG tablet Commonly known as:  TYLENOL Take 2 tablets (650 mg total) by mouth every 6 (six) hours as needed for mild pain, fever or headache.   amoxicillin-clavulanate 875-125 MG tablet Commonly known as:  AUGMENTIN Take 1 tablet by mouth 2 (two) times daily for 2 days. Start taking on:  03/27/2018   artificial tears Oint ophthalmic ointment Commonly known as:  [place] into both eyes every 8 (eight) hours. What changed:  how much to take   aspirin 325 MG EC tablet Take 1 tablet (325 mg total) by mouth daily with breakfast. Start taking on:  03/27/2018   feeding supplement (PRO-STAT SUGAR FREE 64) Liqd Take 30 mLs by mouth 2 (two) times daily.   ferrous sulfate 325 (65 FE) MG tablet Take 1 tablet (325 mg total) by mouth daily with breakfast. Start taking on:  03/27/2018   guaiFENesin 600 MG 12 hr tablet Commonly known as:  MUCINEX Take 600 mg by mouth 2 (two) times daily.   LORazepam 0.5 MG tablet Commonly known as:  ATIVAN Take 0.5 mg by mouth daily.   polyethylene glycol packet Commonly known as:  MIRALAX / GLYCOLAX Take 17 g by mouth daily. Start taking on:  03/27/2018   potassium chloride 10 MEQ tablet Commonly known as:  K-DUR Take 10 mEq by mouth 2 (two) times daily.   senna-docusate 8.6-50 MG tablet Commonly known as:  Senokot-S Take 1 tablet by mouth 2 (two) times daily.   simethicone 40  mg/0.8ml Susp Commonly known as:  MYLICON Take 409 mg by mouth every 8 (eight) hours as needed for flatulence.   sodium chloride 1 g tablet Take 1 g by mouth 2 (two) times daily.   traMADol 50 MG tablet Commonly known as:  ULTRAM Take 1 tablet (50 mg total) by mouth every 6 (six) hours as needed. What changed:    how much to take  reasons to take this            Discharge Care Instructions  (From admission, onward)         Start     Ordered   03/23/18 0000  Weight bearing as tolerated    Question Answer Comment  Laterality left   Extremity Lower      03/23/18 0920         Allergies  Allergen Reactions  . Nitrofurantoin Monohyd Macro Nausea Only    Contact information for follow-up providers    Dorna Leitz, MD. Schedule an appointment as soon as possible for a visit in 2 weeks.   Specialty:  Orthopedic Surgery Contact information:  [place] [address] [phone]        Hayden Rasmussen, MD. Schedule an appointment as soon as possible for a visit in 1 week(s).   Specialty:  Family Medicine Contact information: [place] [address] [phone]            Contact information for after-discharge care    Destination    [facility] Preferred SNF .   Service:  Skilled Nursing Contact information: [place] [phone]                   The results of significant diagnostics from this hospitalization (including imaging, microbiology, ancillary and laboratory) are listed below for reference.    Significant Diagnostic Studies: Dg Chest 2 View  Result Date: 03/22/2018 CLINICAL DATA:  Unwitnessed fall.  Chest pain. EXAM: CHEST - 2 VIEW COMPARISON:  Radiographs of January 22, 2018. FINDINGS: Stable cardiomediastinal silhouette. Atherosclerosis of thoracic aorta is noted. No pneumothorax is noted. No significant pleural effusion is  noted. Left lung is clear. Mild right lower lobe opacity is noted concerning for atelectasis or infiltrate. Bony thorax is unremarkable. IMPRESSION: Mild right lower lobe atelectasis or infiltrate is noted. Aortic Atherosclerosis (ICD10-I70.0). Electronically Signed   By: Marijo Conception, M.D.   On: 03/22/2018 21:47   Dg Abd 1 View  Result Date: 03/24/2018 CLINICAL DATA:  Abdominal distension EXAM: ABDOMEN - 1 VIEW COMPARISON:  None. FINDINGS: Scattered large and small bowel gas is noted. Ingested tablets are noted in the left mid abdomen. Changes of prior fixation of the proximal left femur are seen. No acute bony abnormality is noted. Scoliosis in the lumbar spine is noted as well as old pubic rami fractures. IMPRESSION: Status post left hip fixation. Nonspecific abdomen. Electronically Signed   By: Inez Catalina M.D.   On: 03/24/2018 10:30   Dg C-arm 1-60 Min-no Report  Result Date: 03/23/2018 Fluoroscopy was utilized by the requesting physician.  No radiographic interpretation.   Dg Hip Operative Unilat W Or W/o Pelvis Left  Result Date: 03/23/2018 CLINICAL DATA:  ORIF left hip fracture EXAM: OPERATIVE left HIP (WITH PELVIS IF PERFORMED) 5 VIEWS TECHNIQUE: Fluoroscopic spot image(s) were submitted for interpretation post-operatively. COMPARISON:  03/22/2018. FINDINGS: Gamma nail fixation of an intertrochanteric/basicervical femoral fracture on the left. Distal locking screw. Components appear well positioned IMPRESSION: Good appearance following ORIF of left proximal femur fracture. Electronically Signed   By: Nelson Chimes M.D.   On: 03/23/2018 10:32   Dg Hip Unilat W Or Wo Pelvis 2-3 Views Left  Result Date: 03/22/2018 CLINICAL DATA:  Left hip pain after unwitnessed fall. EXAM: DG HIP (WITH OR WITHOUT PELVIS) 2-3V LEFT COMPARISON:  None. FINDINGS: Moderately angulated fracture is seen involving the left femoral neck. Old healed fractures of the left inferior and superior pubic rami are noted.  IMPRESSION: Moderately angulated proximal left femoral neck fracture. Electronically Signed   By: Marijo Conception, M.D.   On: 03/22/2018 20:40    Microbiology: Recent Results (from the past 240 hour(s))  MRSA PCR Screening     Status: None   Collection Time: 03/23/18  4:45 AM  Result Value Ref Range Status   MRSA by PCR NEGATIVE NEGATIVE Final    Comment:        The GeneXpert MRSA Assay (FDA approved for NASAL specimens only), is one component of a comprehensive MRSA colonization surveillance program. It is not  intended to diagnose MRSA infection nor to guide or monitor treatment for MRSA infections. Performed at [facility], [place] [address], [place], [place] [address]      Labs: Basic Metabolic Panel: Recent Labs  Lab 03/22/18 2102 03/23/18 0413 03/24/18 0438 03/25/18 0442 03/26/18 0403  NA 135 134* 134* 134* 133*  K 3.2* 3.6 4.6 4.0 3.8  CL 100 99 100 103 99  CO2 26 24 26 26 24   GLUCOSE 125* 111* 126* 96 107*  BUN 13 10 18 16 18   CREATININE 0.45 0.44 0.58 0.48 0.45  CALCIUM 8.8* 8.5* 8.9 8.1* 8.6*  MG  --  1.7  --   --   --    Liver Function Tests: No results for input(s): AST, ALT, ALKPHOS, BILITOT, PROT, ALBUMIN in the last 168 hours. No results for input(s): LIPASE, AMYLASE in the last 168 hours. No results for input(s): AMMONIA in the last 168 hours. CBC: Recent Labs  Lab 03/22/18 2102 03/23/18 0413 03/24/18 0438 03/25/18 0442 03/26/18 0403  WBC 6.9 11.4* 13.5* 9.7 10.9*  NEUTROABS 5.5  --   --   --   --   HGB 12.7 13.1 9.7* 8.0* 11.5*  HCT 38.5 40.2 29.9* 25.0* 34.9*  MCV 99.5 100.2* 99.7 103.3* 95.4  PLT 187 206 177 128* 186   Cardiac Enzymes: No results for input(s): CKTOTAL, CKMB, CKMBINDEX, TROPONINI in the last 168 hours. BNP: BNP (last 3 results) No results for input(s): BNP in the last 8760 hours.  ProBNP (last 3 results) No results for input(s): PROBNP in the last 8760 hours.  CBG: No results for input(s): GLUCAP  in the last 168 hours.     Signed:  Alma Friendly, MD [facility] 03/26/2018, 12:34 PM

## 2018-03-26 NOTE — Progress Notes (Signed)
Attempted to call report to [facility] and left on hold for 7 minutes.

## 2018-04-25 ENCOUNTER — Encounter (HOSPITAL_COMMUNITY): Payer: Self-pay

## 2018-04-25 ENCOUNTER — Emergency Department (HOSPITAL_COMMUNITY): Payer: Medicare Other

## 2018-04-25 ENCOUNTER — Inpatient Hospital Stay (HOSPITAL_COMMUNITY)
Admission: EM | Admit: 2018-04-25 | Discharge: 2018-04-30 | DRG: 480 | Disposition: A | Discharge status: Skilled Nursing Facility | Payer: Medicare Other | Source: Skilled Nursing Facility | Attending: Internal Medicine | Admitting: Internal Medicine

## 2018-04-25 DIAGNOSIS — D62 Acute posthemorrhagic anemia: Secondary | ICD-10-CM | POA: Diagnosis not present

## 2018-04-25 DIAGNOSIS — S72141A Displaced intertrochanteric fracture of right femur, initial encounter for closed fracture: Secondary | ICD-10-CM | POA: Diagnosis present

## 2018-04-25 DIAGNOSIS — Z681 Body mass index (BMI) 19 or less, adult: Secondary | ICD-10-CM | POA: Diagnosis not present

## 2018-04-25 DIAGNOSIS — W1830XA Fall on same level, unspecified, initial encounter: Secondary | ICD-10-CM | POA: Diagnosis not present

## 2018-04-25 DIAGNOSIS — Z79891 Long term (current) use of opiate analgesic: Secondary | ICD-10-CM | POA: Diagnosis not present

## 2018-04-25 DIAGNOSIS — E871 Hypo-osmolality and hyponatremia: Secondary | ICD-10-CM | POA: Diagnosis present

## 2018-04-25 DIAGNOSIS — Z79899 Other long term (current) drug therapy: Secondary | ICD-10-CM | POA: Diagnosis not present

## 2018-04-25 DIAGNOSIS — E876 Hypokalemia: Secondary | ICD-10-CM | POA: Diagnosis present

## 2018-04-25 DIAGNOSIS — S22029A Unspecified fracture of second thoracic vertebra, initial encounter for closed fracture: Secondary | ICD-10-CM | POA: Diagnosis present

## 2018-04-25 DIAGNOSIS — Z515 Encounter for palliative care: Secondary | ICD-10-CM | POA: Diagnosis not present

## 2018-04-25 DIAGNOSIS — Z96642 Presence of left artificial hip joint: Secondary | ICD-10-CM | POA: Diagnosis not present

## 2018-04-25 DIAGNOSIS — F0390 Unspecified dementia without behavioral disturbance: Secondary | ICD-10-CM | POA: Diagnosis present

## 2018-04-25 DIAGNOSIS — I480 Paroxysmal atrial fibrillation: Secondary | ICD-10-CM

## 2018-04-25 DIAGNOSIS — R451 Restlessness and agitation: Secondary | ICD-10-CM | POA: Diagnosis not present

## 2018-04-25 DIAGNOSIS — Z853 Personal history of malignant neoplasm of breast: Secondary | ICD-10-CM | POA: Diagnosis not present

## 2018-04-25 DIAGNOSIS — Y92009 Unspecified place in unspecified non-institutional (private) residence as the place of occurrence of the external cause: Secondary | ICD-10-CM | POA: Diagnosis not present

## 2018-04-25 DIAGNOSIS — D649 Anemia, unspecified: Secondary | ICD-10-CM | POA: Diagnosis not present

## 2018-04-25 DIAGNOSIS — R4189 Other symptoms and signs involving cognitive functions and awareness: Secondary | ICD-10-CM | POA: Diagnosis present

## 2018-04-25 DIAGNOSIS — Z66 Do not resuscitate: Secondary | ICD-10-CM | POA: Diagnosis present

## 2018-04-25 DIAGNOSIS — Z7982 Long term (current) use of aspirin: Secondary | ICD-10-CM | POA: Diagnosis not present

## 2018-04-25 DIAGNOSIS — W19XXXD Unspecified fall, subsequent encounter: Secondary | ICD-10-CM | POA: Diagnosis not present

## 2018-04-25 DIAGNOSIS — F429 Obsessive-compulsive disorder, unspecified: Secondary | ICD-10-CM | POA: Diagnosis present

## 2018-04-25 DIAGNOSIS — E43 Unspecified severe protein-calorie malnutrition: Secondary | ICD-10-CM | POA: Diagnosis present

## 2018-04-25 DIAGNOSIS — I517 Cardiomegaly: Secondary | ICD-10-CM | POA: Diagnosis present

## 2018-04-25 DIAGNOSIS — S42011A Anterior displaced fracture of sternal end of right clavicle, initial encounter for closed fracture: Secondary | ICD-10-CM | POA: Diagnosis present

## 2018-04-25 DIAGNOSIS — I4891 Unspecified atrial fibrillation: Secondary | ICD-10-CM | POA: Diagnosis present

## 2018-04-25 DIAGNOSIS — S72001G Fracture of unspecified part of neck of right femur, subsequent encounter for closed fracture with delayed healing: Secondary | ICD-10-CM | POA: Diagnosis not present

## 2018-04-25 DIAGNOSIS — K047 Periapical abscess without sinus: Secondary | ICD-10-CM | POA: Diagnosis present

## 2018-04-25 DIAGNOSIS — Z888 Allergy status to other drugs, medicaments and biological substances status: Secondary | ICD-10-CM

## 2018-04-25 DIAGNOSIS — I10 Essential (primary) hypertension: Secondary | ICD-10-CM | POA: Diagnosis present

## 2018-04-25 DIAGNOSIS — Z7189 Other specified counseling: Secondary | ICD-10-CM | POA: Diagnosis not present

## 2018-04-25 DIAGNOSIS — M47812 Spondylosis without myelopathy or radiculopathy, cervical region: Secondary | ICD-10-CM | POA: Diagnosis present

## 2018-04-25 DIAGNOSIS — G8929 Other chronic pain: Secondary | ICD-10-CM | POA: Diagnosis present

## 2018-04-25 DIAGNOSIS — W19XXXA Unspecified fall, initial encounter: Secondary | ICD-10-CM | POA: Diagnosis present

## 2018-04-25 DIAGNOSIS — Z419 Encounter for procedure for purposes other than remedying health state, unspecified: Secondary | ICD-10-CM

## 2018-04-25 DIAGNOSIS — S72001D Fracture of unspecified part of neck of right femur, subsequent encounter for closed fracture with routine healing: Secondary | ICD-10-CM | POA: Diagnosis not present

## 2018-04-25 DIAGNOSIS — M79604 Pain in right leg: Secondary | ICD-10-CM | POA: Diagnosis present

## 2018-04-25 DIAGNOSIS — S72001A Fracture of unspecified part of neck of right femur, initial encounter for closed fracture: Secondary | ICD-10-CM | POA: Diagnosis not present

## 2018-04-25 LAB — BASIC METABOLIC PANEL
Anion gap: 10 (ref 5–15)
BUN: 21 mg/dL (ref 8–23)
CO2: 26 mmol/L (ref 22–32)
Calcium: 8.8 mg/dL — ABNORMAL LOW (ref 8.9–10.3)
Chloride: 97 mmol/L — ABNORMAL LOW (ref 98–111)
Creatinine, Ser: 0.45 mg/dL (ref 0.44–1.00)
GFR calc Af Amer: >60 mL/min (ref >60)
GFR calc non Af Amer: >60 mL/min (ref >60)
Glucose, Bld: 163 mg/dL — ABNORMAL HIGH (ref 70–99)
Potassium: 3.1 mmol/L — ABNORMAL LOW (ref 3.5–5.1)
Sodium: 133 mmol/L — ABNORMAL LOW (ref 135–145)

## 2018-04-25 LAB — CBC WITH DIFFERENTIAL/PLATELET
Abs Immature Granulocytes: 0.04 10*3/uL (ref 0.00–0.07)
Basophils Absolute: 0 10*3/uL (ref 0.0–0.1)
Basophils Relative: 0 %
EOS PCT: 0 %
Eosinophils Absolute: 0 10*3/uL (ref 0.0–0.5)
HCT: 38.6 % (ref 36.0–46.0)
Hemoglobin: 12.9 g/dL (ref 12.0–15.0)
Immature Granulocytes: 0 %
LYMPHS PCT: 4 %
Lymphs Abs: 0.4 10*3/uL — ABNORMAL LOW (ref 0.7–4.0)
MCH: 33.2 pg (ref 26.0–34.0)
MCHC: 33.4 g/dL (ref 30.0–36.0)
MCV: 99.5 fL (ref 80.0–100.0)
MONO ABS: 0.7 10*3/uL (ref 0.1–1.0)
Monocytes Relative: 6 %
Neutro Abs: 10.2 10*3/uL — ABNORMAL HIGH (ref 1.7–7.7)
Neutrophils Relative %: 90 %
Platelets: 241 10*3/uL (ref 150–400)
RBC: 3.88 MIL/uL (ref 3.87–5.11)
RDW: 13.3 % (ref 11.5–15.5)
WBC: 11.4 10*3/uL — ABNORMAL HIGH (ref 4.0–10.5)
nRBC: 0 % (ref 0.0–0.2)

## 2018-04-25 LAB — PROTIME-INR
INR: 1.02
PROTHROMBIN TIME: 13.3 s (ref 11.4–15.2)

## 2018-04-25 LAB — TYPE AND SCREEN
ABO/RH(D): AB POS
Antibody Screen: NEGATIVE

## 2018-04-25 MED ORDER — METOPROLOL TARTRATE 5 MG/5ML IV SOLN
INTRAVENOUS | Status: AC
  Administered 2018-04-25: 2.5 mg via INTRAVENOUS
  Filled 2018-04-25: qty 5

## 2018-04-25 MED ORDER — HYDRALAZINE HCL 20 MG/ML IJ SOLN: 5.0000 mg | INTRAMUSCULAR | Status: DC | PRN

## 2018-04-25 MED ORDER — OXYCODONE-ACETAMINOPHEN 5-325 MG PO TABS
1.0000 | ORAL_TABLET | ORAL | Status: DC | PRN
  Administered 2018-04-25 – 2018-04-27 (×2): 1 via ORAL
  Filled 2018-04-25 (×2): qty 1

## 2018-04-25 MED ORDER — METHOCARBAMOL 500 MG PO TABS
500.0000 mg | ORAL_TABLET | Freq: Three times a day (TID) | ORAL | Status: DC | PRN
  Administered 2018-04-27: 500 mg via ORAL
  Filled 2018-04-25: qty 1

## 2018-04-25 MED ORDER — BENZONATATE 100 MG PO CAPS: 100.0000 mg | ORAL_CAPSULE | Freq: Three times a day (TID) | ORAL | Status: DC | PRN

## 2018-04-25 MED ORDER — POTASSIUM CHLORIDE 10 MEQ/100ML IV SOLN
10.0000 meq | Freq: Once | INTRAVENOUS | Status: AC
  Administered 2018-04-25: 10 meq via INTRAVENOUS
  Filled 2018-04-25: qty 100

## 2018-04-25 MED ORDER — FERROUS SULFATE 325 (65 FE) MG PO TABS
325.0000 mg | ORAL_TABLET | Freq: Every day | ORAL | Status: DC
  Administered 2018-04-27 – 2018-04-30 (×4): 325 mg via ORAL
  Filled 2018-04-25 (×4): qty 1

## 2018-04-25 MED ORDER — ONDANSETRON HCL 4 MG/2ML IJ SOLN: 4.0000 mg | Freq: Three times a day (TID) | INTRAMUSCULAR | Status: DC | PRN

## 2018-04-25 MED ORDER — POLYETHYLENE GLYCOL 3350 17 G PO PACK: 17.0000 g | PACK | Freq: Every day | ORAL | Status: DC | PRN

## 2018-04-25 MED ORDER — SIMETHICONE 40 MG/0.6ML PO SUSP
120.0000 mg | Freq: Three times a day (TID) | ORAL | Status: DC | PRN
  Administered 2018-04-26: 120 mg via ORAL
  Filled 2018-04-25 (×2): qty 1.8

## 2018-04-25 MED ORDER — MAGNESIUM SULFATE IN D5W 1-5 GM/100ML-% IV SOLN
1.0000 g | Freq: Once | INTRAVENOUS | Status: AC
  Administered 2018-04-26: 1 g via INTRAVENOUS
  Filled 2018-04-25 (×2): qty 100

## 2018-04-25 MED ORDER — ASPIRIN EC 325 MG PO TBEC
325.0000 mg | DELAYED_RELEASE_TABLET | Freq: Every day | ORAL | Status: DC
  Administered 2018-04-27 – 2018-04-28 (×2): 325 mg via ORAL
  Filled 2018-04-25 (×2): qty 1

## 2018-04-25 MED ORDER — PRO-STAT SUGAR FREE PO LIQD
30.0000 mL | Freq: Two times a day (BID) | ORAL | Status: DC
  Administered 2018-04-26 – 2018-04-30 (×7): 30 mL via ORAL
  Filled 2018-04-25 (×8): qty 30

## 2018-04-25 MED ORDER — ACETAMINOPHEN 325 MG PO TABS
650.0000 mg | ORAL_TABLET | Freq: Four times a day (QID) | ORAL | Status: DC | PRN
  Administered 2018-04-26: 650 mg via ORAL
  Filled 2018-04-25: qty 2

## 2018-04-25 MED ORDER — LORAZEPAM 2 MG/ML IJ SOLN
INTRAMUSCULAR | Status: AC
  Administered 2018-04-25: 1 mg
  Filled 2018-04-25: qty 1

## 2018-04-25 MED ORDER — METOPROLOL TARTRATE 5 MG/5ML IV SOLN
2.5000 mg | Freq: Once | INTRAVENOUS | Status: AC
  Administered 2018-04-25: 2.5 mg via INTRAVENOUS

## 2018-04-25 MED ORDER — MORPHINE SULFATE (PF) 2 MG/ML IV SOLN
0.5000 mg | INTRAVENOUS | Status: DC | PRN
  Administered 2018-04-25 – 2018-04-28 (×7): 0.5 mg via INTRAVENOUS
  Filled 2018-04-25 (×8): qty 1

## 2018-04-25 MED ORDER — POTASSIUM CHLORIDE ER 10 MEQ PO TBCR: 10.0000 meq | EXTENDED_RELEASE_TABLET | Freq: Two times a day (BID) | ORAL | Status: DC

## 2018-04-25 MED ORDER — POTASSIUM CHLORIDE 20 MEQ/15ML (10%) PO SOLN: 40.0000 meq | Freq: Once | ORAL | Status: DC

## 2018-04-25 MED ORDER — ONDANSETRON HCL 4 MG/2ML IJ SOLN
4.0000 mg | Freq: Once | INTRAMUSCULAR | Status: AC
  Administered 2018-04-25: 4 mg via INTRAVENOUS
  Filled 2018-04-25: qty 2

## 2018-04-25 MED ORDER — FENTANYL CITRATE (PF) 100 MCG/2ML IJ SOLN
100.0000 ug | Freq: Once | INTRAMUSCULAR | Status: AC
  Administered 2018-04-25: 100 ug via INTRAVENOUS
  Filled 2018-04-25: qty 2

## 2018-04-25 MED ORDER — DILTIAZEM HCL 30 MG PO TABS
30.0000 mg | ORAL_TABLET | Freq: Two times a day (BID) | ORAL | Status: DC
  Administered 2018-04-26 – 2018-04-30 (×8): 30 mg via ORAL
  Filled 2018-04-25 (×9): qty 1

## 2018-04-25 MED ORDER — POTASSIUM CHLORIDE 20 MEQ/15ML (10%) PO SOLN
40.0000 meq | Freq: Once | ORAL | Status: AC
  Administered 2018-04-26: 40 meq via ORAL
  Filled 2018-04-25: qty 30

## 2018-04-25 MED ORDER — SODIUM CHLORIDE 0.9 % IV SOLN
INTRAVENOUS | Status: DC
  Administered 2018-04-26 (×3): via INTRAVENOUS

## 2018-04-25 MED ORDER — METOPROLOL TARTRATE 5 MG/5ML IV SOLN
2.5000 mg | INTRAVENOUS | Status: DC | PRN
  Administered 2018-04-28: 2.5 mg via INTRAVENOUS
  Filled 2018-04-25: qty 5

## 2018-04-25 MED ORDER — SODIUM CHLORIDE 1 G PO TABS
1.0000 g | ORAL_TABLET | Freq: Two times a day (BID) | ORAL | Status: DC
  Administered 2018-04-26 – 2018-04-30 (×8): 1 g via ORAL
  Filled 2018-04-25 (×11): qty 1

## 2018-04-25 MED ORDER — ARTIFICIAL TEARS OPHTHALMIC OINT
1.0000 "application " | TOPICAL_OINTMENT | Freq: Three times a day (TID) | OPHTHALMIC | Status: DC
  Administered 2018-04-26 – 2018-04-30 (×15): 1 via OPHTHALMIC
  Filled 2018-04-25: qty 3.5

## 2018-04-25 MED ORDER — LORAZEPAM 0.5 MG PO TABS
0.2500 mg | ORAL_TABLET | Freq: Two times a day (BID) | ORAL | Status: DC | PRN
  Administered 2018-04-27 (×2): 0.25 mg via ORAL
  Filled 2018-04-25 (×2): qty 1

## 2018-04-25 MED ORDER — ZOLPIDEM TARTRATE 5 MG PO TABS
5.0000 mg | ORAL_TABLET | Freq: Every evening | ORAL | Status: DC | PRN
  Administered 2018-04-26 – 2018-04-28 (×2): 5 mg via ORAL
  Filled 2018-04-25 (×2): qty 1

## 2018-04-25 MED ORDER — DIVALPROEX SODIUM 125 MG PO DR TAB
125.0000 mg | DELAYED_RELEASE_TABLET | Freq: Every day | ORAL | Status: DC
  Administered 2018-04-26 – 2018-04-28 (×4): 125 mg via ORAL
  Filled 2018-04-25 (×6): qty 1

## 2018-04-25 NOTE — ED Notes (Signed)
ED TO INPATIENT HANDOFF REPORT  Name/Age/Gender Julie [name] 84 y.o. female  Code Status    Code Status Orders  (From admission, onward)         Start     Ordered   04/25/18 2037  Do not attempt resuscitation (DNR)  Continuous    Question Answer Comment  In the event of cardiac or respiratory ARREST Do not call a "code blue"   In the event of cardiac or respiratory ARREST Do not perform Intubation, CPR, defibrillation or ACLS   In the event of cardiac or respiratory ARREST Use medication by any route, position, wound care, and other measures to relive pain and suffering. May use oxygen, suction and manual treatment of airway obstruction as needed for comfort.      04/25/18 2038        Code Status History    Date Active Date Inactive Code Status Order ID Comments User Context   03/22/2018 2322 03/26/2018 1723 DNR [id number]  Rise Patience, MD Inpatient   01/23/2018 0858 01/28/2018 2207 DNR [id number]  Juanito Doom, MD Inpatient   01/22/2018 1634 01/23/2018 0858 Full Code [id number]  Janora Norlander, MD ED   01/22/2018 1554 01/22/2018 1634 DNR [id number]  Rush Farmer, MD ED      Home/SNF/Other Nursing Home  Chief Complaint fall/hip fracture  Level of Care/Admitting Diagnosis ED Disposition    ED Disposition Condition [facility]: [place] [[id number]]  Level of Care: Cardiac Telemetry [103]  Diagnosis: Closed right hip fracture [facility]) [[id number]]  Admitting Physician: Ivor Costa [[id number]]  Attending Physician: Ivor Costa [phone]  Estimated length of stay: past midnight tomorrow  Certification:: I certify this patient will need inpatient services for at least 2 midnights  PT Class (Do Not Modify): Inpatient [101]  PT Acc Code (Do Not Modify): Private [1]       Medical History Past Medical History:  Diagnosis Date  . Anxiety   . Cancer ([place])   . Hypertension     Allergies Allergies  Allergen Reactions  . Nitrofurantoin  Monohyd Macro Nausea Only    IV Location/Drains/Wounds Patient Lines/Drains/Airways Status   Active Line/Drains/Airways    Name:   Placement date:   Placement time:   Site:   Days:   Peripheral IV 04/25/18 Left Antecubital   04/25/18    1724    Antecubital   less than 1   External Urinary Catheter   04/25/18    2009    -   less than 1   Incision (Closed) 03/23/18 Hip Left   03/23/18    0857     33          Labs/Imaging Results for orders placed or performed during the hospital encounter of 04/25/18 (from the past 48 hour(s))  Basic metabolic panel     Status: Abnormal   Collection Time: 04/25/18  5:40 PM  Result Value Ref Range   Sodium 133 (L) 135 - 145 mmol/L   Potassium 3.1 (L) 3.5 - 5.1 mmol/L   Chloride 97 (L) 98 - 111 mmol/L   CO2 26 22 - 32 mmol/L   Glucose, Bld 163 (H) 70 - 99 mg/dL   BUN 21 8 - 23 mg/dL   Creatinine, Ser 0.45 0.44 - 1.00 mg/dL   Calcium 8.8 (L) 8.9 - 10.3 mg/dL   GFR calc non Af Amer >60 >60 mL/min   GFR calc Af Amer >60 >60 mL/min  Anion gap 10 5 - 15    Comment: Performed at [facility], [place] [address], [place], [place] [address]  CBC WITH DIFFERENTIAL     Status: Abnormal   Collection Time: 04/25/18  5:40 PM  Result Value Ref Range   WBC 11.4 (H) 4.0 - 10.5 K/uL   RBC 3.88 3.87 - 5.11 MIL/uL   Hemoglobin 12.9 12.0 - 15.0 g/dL   HCT 38.6 36.0 - 46.0 %   MCV 99.5 80.0 - 100.0 fL   MCH 33.2 26.0 - 34.0 pg   MCHC 33.4 30.0 - 36.0 g/dL   RDW 13.3 11.5 - 15.5 %   Platelets 241 150 - 400 K/uL   nRBC 0.0 0.0 - 0.2 %   Neutrophils Relative % 90 %   Neutro Abs 10.2 (H) 1.7 - 7.7 K/uL   Lymphocytes Relative 4 %   Lymphs Abs 0.4 (L) 0.7 - 4.0 K/uL   Monocytes Relative 6 %   Monocytes Absolute 0.7 0.1 - 1.0 K/uL   Eosinophils Relative 0 %   Eosinophils Absolute 0.0 0.0 - 0.5 K/uL   Basophils Relative 0 %   Basophils Absolute 0.0 0.0 - 0.1 K/uL   Immature Granulocytes 0 %   Abs Immature Granulocytes 0.04 0.00 - 0.07 K/uL     Comment: Performed at [facility], [place] [address] [place], [place] [address]  Protime-INR     Status: None   Collection Time: 04/25/18  5:40 PM  Result Value Ref Range   Prothrombin Time 13.3 11.4 - 15.2 seconds   INR 1.02     Comment: Performed at [facility], [place] [address] [place], [place] [address]  Type and screen [place]     Status: None   Collection Time: 04/25/18  5:40 PM  Result Value Ref Range   ABO/RH(D) AB POS    Antibody Screen NEG    Sample Expiration      04/28/2018 Performed at [facility], [place] [address] [place], [place] [address]    Ct Head Wo Contrast  Result Date: 04/25/2018 CLINICAL DATA:  Fall with dementia EXAM: CT HEAD WITHOUT CONTRAST TECHNIQUE: Contiguous axial images were obtained from the base of the skull through the vertex without intravenous contrast. COMPARISON:  CT brain and cervical spine 02/02/2018 FINDINGS: Brain: No acute territorial infarction, hemorrhage or intracranial mass allowing for motion and patient positioning. Moderate-to-marked atrophy. Moderate severe small vessel ischemic changes of the white matter. Stable ventricle size. Vascular: No hyperdense vessel.  Carotid vascular calcification. Skull: Normal. Negative for fracture or focal lesion. Sinuses/Orbits: No acute finding. Other: None IMPRESSION: 1. Limited study secondary to motion degradation and patient positioning. 2. No definite CT evidence for acute intracranial abnormality. 3. Atrophy and small vessel ischemic changes of the white matter. Electronically Signed   By: Donavan Foil M.D.   On: 04/25/2018 19:16   Dg Hip Unilat W Or Wo Pelvis 2-3 Views Right  Result Date: 04/25/2018 CLINICAL DATA:  Hip pain EXAM: DG HIP (WITH OR WITHOUT PELVIS) 2-3V RIGHT COMPARISON:  None. FINDINGS: Fixating rod within the left femur. Old left superior and inferior pubic rami fractures. Acute fracture involving the base  of the right femoral neck. No dislocation. IMPRESSION: 1. Acute displaced fracture involving the base of the right femoral neck. 2. Old left superior and inferior pubic rami fractures. Electronically Signed   By: Donavan Foil M.D.   On: 04/25/2018 18:50   EKG Interpretation  Date/Time:  Friday April 25 2018 17:58:36 EST Ventricular Rate:  87 PR Interval:    QRS Duration: 89 QT Interval:  351 QTC Calculation: 423 R Axis:   3 Text Interpretation:  Sinus rhythm Atrial premature complex Left ventricular hypertrophy Anterior Q waves, possibly due to LVH Borderline T abnormalities, inferior leads Confirmed by Lacretia Leigh ([id number]) on 04/25/2018 6:13:13 PM   Pending Labs Unresulted Labs (From admission, onward)    Start     Ordered   04/26/18 0500  Magnesium  Tomorrow morning,   R     04/25/18 1952   04/26/18 0500  TSH  Tomorrow morning,   R     04/25/18 1954   04/26/18 0500  CBC  Tomorrow morning,   R     04/25/18 2038   04/26/18 2774  Basic metabolic panel  Tomorrow morning,   R     04/25/18 2038   04/25/18 2036  Urinalysis, Routine w reflex microscopic  Once,   R     04/25/18 2035   04/25/18 1954  APTT  Once,   R     04/25/18 1953   04/25/18 1954  CK  Once,   R     04/25/18 1953          Vitals/Pain Today's Vitals   04/25/18 2130 04/25/18 2200 04/25/18 2300 04/25/18 2330  BP: 121/74 (!) 170/95 (!) 143/74 102/72  Pulse: 85 (!) 101 97 92  Resp: 10 (!) 24 10 11   Temp:      TempSrc:      SpO2: 98% 94% 91% (!) 84%  PainSc:        Isolation Precautions No active isolations  Medications Medications  potassium chloride 20 MEQ/15ML (10%) solution 40 mEq (has no administration in time range)  magnesium sulfate IVPB 1 g 100 mL (has no administration in time range)  oxyCODONE-acetaminophen (PERCOCET/ROXICET) 5-325 MG per tablet 1 tablet (1 tablet Oral Given 04/25/18 2016)  morphine 2 MG/ML injection 0.5 mg (0.5 mg Intravenous Given 04/25/18 2101)  methocarbamol (ROBAXIN)  tablet 500 mg (has no administration in time range)  hydrALAZINE (APRESOLINE) injection 5 mg (has no administration in time range)  metoprolol tartrate (LOPRESSOR) injection 2.5 mg (has no administration in time range)  aspirin EC tablet 325 mg (has no administration in time range)  LORazepam (ATIVAN) tablet 0.25 mg (has no administration in time range)  polyethylene glycol (MIRALAX / GLYCOLAX) packet 17 g (has no administration in time range)  simethicone (MYLICON) 40 JO/8.7OM suspension 120 mg (has no administration in time range)  ferrous sulfate tablet 325 mg (has no administration in time range)  divalproex (DEPAKOTE) DR tablet 125 mg (has no administration in time range)  feeding supplement (PRO-STAT SUGAR FREE 64) liquid 30 mL (has no administration in time range)  sodium chloride tablet 1 g (has no administration in time range)  benzonatate (TESSALON) capsule 100 mg (has no administration in time range)  artificial tears (LACRILUBE) ophthalmic ointment 1 application (has no administration in time range)  diltiazem (CARDIZEM) tablet 30 mg (has no administration in time range)  ondansetron (ZOFRAN) injection 4 mg (has no administration in time range)  acetaminophen (TYLENOL) tablet 650 mg (has no administration in time range)  zolpidem (AMBIEN) tablet 5 mg (has no administration in time range)  0.9 %  sodium chloride infusion (has no administration in time range)  fentaNYL (SUBLIMAZE) injection 100 mcg (100 mcg Intravenous Given 04/25/18 1742)  ondansetron (ZOFRAN) injection 4 mg (4 mg Intravenous  Given 04/25/18 1809)  LORazepam (ATIVAN) 2 MG/ML injection (1 mg  Given 04/25/18 1800)  metoprolol tartrate (LOPRESSOR) injection 2.5 mg (2.5 mg Intravenous Given 04/25/18 1756)  potassium chloride 10 mEq in 100 mL IVPB (0 mEq Intravenous Stopped 04/25/18 2019)    Mobility non-ambulatory

## 2018-04-25 NOTE — ED Notes (Signed)
Pt placed on purewick catheter due to hip fx.

## 2018-04-25 NOTE — ED Notes (Signed)
Pt connected to [facility] but has not given urine sample at this time.

## 2018-04-25 NOTE — Consult Note (Signed)
Julie [name] is an 84 year old female status post fall with a right proximal femur fracture.  Orthopedics was consulted.  I reviewed the images of the right hip and it is difficult to understand the fracture therefore will order a CT scan to aid with surgical planning purposes.  If the CT reveals this is a femoral neck fracture then patient will need a hip hemiarthroplasty if this is more of an intertrochanteric fracture will plan for trochanteric fixation nail.  I did discuss surgical intervention with the patient's daughter and she did consent for her mother to undergo surgical fixation of her hip fracture.  Patient is n.p.o. past midnight.

## 2018-04-25 NOTE — ED Provider Notes (Signed)
[facility] DEPT Provider Note   CSN: [id number] Arrival date & time: 04/25/18  1709     History   Chief Complaint Chief Complaint  Patient presents with  . Fall  . Hip Injury    HPI Julie [name] is a 84 y.o. female because of extreme hypertension brought in by EMS from [facility] facility for right hip fracture.  Per EMS, they found patient down at about 11 AM this morning after a fall.  Fall was unwitnessed.  Patient states that she was attempting get out of the bathroom that her feet got caught in the door.  She denies any preceding chest pain or dizziness.  Patient states that she did not hit her head or lose consciousness.  She is unclear of how long she was sitting there.  She states that she tried to get up but was having pain in the right lower extremity.  X-ray was performed at the facility that confirmed a right intro trochanteric fracture and patient was sent to the ED for further evaluation.  She has previously had a left hip replacement done by Dr. Berenice Primas (orthopedics).  Patient denies any numbness, chest pain, difficulty breathing, abdominal pain, nausea/vomiting.  The history is provided by the patient.    Past Medical History:  Diagnosis Date  . Anxiety   . Cancer ([place])   . Hypertension     Patient Active Problem List   Diagnosis Date Noted  . Fracture of femoral neck, right, closed ([place]) 04/25/2018  . Atrial fibrillation with RVR ([place]) 04/25/2018  . Closed left hip fracture, initial encounter ([place]) 03/22/2018  . Hip fracture ([place]) 03/22/2018  . Protein-calorie malnutrition, severe 01/23/2018  . Anxiety 01/22/2018  . Mass of breast, left 01/22/2018  . Hyponatremia 01/22/2018  . Cognitive decline 01/22/2018  . Opacity of lung on imaging study 01/22/2018  . Anorexia 01/22/2018  . Weight loss 01/22/2018  . Hypokalemia 01/22/2018  . HTN (hypertension) 07/19/2011    Past Surgical History:  Procedure Laterality Date  .  INTRAMEDULLARY (IM) NAIL INTERTROCHANTERIC Left 03/23/2018   Procedure: INTRAMEDULLARY (IM) NAIL INTERTROCHANTRIC;  Surgeon: Dorna Leitz, MD;  Location: [facility] ORS;  Service: Orthopedics;  Laterality: Left;     OB History   No obstetric history on file.      Home Medications    Prior to Admission medications   Medication Sig Start Date End Date Taking? Authorizing Provider  acetaminophen (TYLENOL) 325 MG tablet Take 2 tablets (650 mg total) by mouth every 6 (six) hours as needed for mild pain, fever or headache. 01/28/18  Yes Cherene Altes, MD  Amino Acids-Protein Hydrolys (FEEDING SUPPLEMENT, PRO-STAT SUGAR FREE 64,) LIQD Take 30 mLs by mouth 2 (two) times daily. 03/26/18  Yes Alma Friendly, MD  artificial tears (LACRILUBE) OINT ophthalmic ointment Place into both eyes every 8 (eight) hours. Patient taking differently: Place 1 application into both eyes every 8 (eight) hours.  01/28/18  Yes Cherene Altes, MD  aspirin EC 325 MG EC tablet Take 1 tablet (325 mg total) by mouth daily with breakfast. 03/27/18  Yes Alma Friendly, MD  benzonatate (TESSALON) 100 MG capsule Take 100 mg by mouth every 8 (eight) hours as needed for cough.   Yes [provider]  divalproex (DEPAKOTE) 125 MG DR tablet Take 125 mg by mouth at bedtime.   Yes [provider]  ferrous sulfate 325 (65 FE) MG tablet Take 1 tablet (325 mg total) by mouth daily with  breakfast. 03/27/18  Yes Alma Friendly, MD  LORazepam (ATIVAN) 0.5 MG tablet Take 0.5 mg by mouth every 6 (six) hours.  03/18/18  Yes [provider]  polyethylene glycol (MIRALAX / GLYCOLAX) packet Take 17 g by mouth daily. Patient taking differently: Take 17 g by mouth daily as needed for mild constipation or moderate constipation.  03/27/18  Yes Alma Friendly, MD  potassium chloride (K-DUR) 10 MEQ tablet Take 10 mEq by mouth 2 (two) times daily. 03/14/18  Yes [provider]  simethicone  (MYLICON) 40 DE/0.8XK SUSP Take 120 mg by mouth every 8 (eight) hours as needed for flatulence.   Yes [provider]  sodium chloride 1 g tablet Take 1 g by mouth 2 (two) times daily.    Yes [provider]  traMADol (ULTRAM) 50 MG tablet Take 1 tablet (50 mg total) by mouth every 6 (six) hours as needed. Patient taking differently: Take 50 mg by mouth every 6 (six) hours as needed for moderate pain or severe pain.  03/23/18 03/23/19 Yes Gary Fleet, PA-C  senna-docusate (SENOKOT-S) 8.6-50 MG tablet Take 1 tablet by mouth 2 (two) times daily. Patient not taking: Reported on 04/25/2018 03/26/18   Alma Friendly, MD    Family History Family History  Problem Relation Age of Onset  . Breast cancer Neg Hx     Social History Social History   Tobacco Use  . Smoking status: Never Smoker  . Smokeless tobacco: Never Used  Substance Use Topics  . Alcohol use: Never    Frequency: Never  . Drug use: Never     Allergies   Nitrofurantoin monohyd macro   Review of Systems Review of Systems  Respiratory: Negative for shortness of breath.   Gastrointestinal: Negative for abdominal pain, nausea and vomiting.  Musculoskeletal:       Right hip pain  Neurological: Negative for weakness and numbness.  All other systems reviewed and are negative.    Physical Exam Updated Vital Signs BP 102/72   Pulse 92   Temp 99.6 F (37.6 C) (Rectal)   Resp 11   SpO2 (!) 84%   Physical Exam Vitals signs and nursing note reviewed.  Constitutional:      Appearance: Normal appearance. She is well-developed.  HENT:     Head: Normocephalic and atraumatic.  Eyes:     General: Lids are normal.     Conjunctiva/sclera: Conjunctivae normal.     Pupils: Pupils are equal, round, and reactive to light.  Neck:     Musculoskeletal: Full passive range of motion without pain.  Cardiovascular:     Rate and Rhythm: Normal rate and regular rhythm.     Pulses: Normal pulses.           Radial pulses are 2+ on the right side and 2+ on the left side.       Dorsalis pedis pulses are 2+ on the right side and 2+ on the left side.     Heart sounds: Normal heart sounds. No murmur. No friction rub. No gallop.   Pulmonary:     Effort: Pulmonary effort is normal.     Breath sounds: Normal breath sounds.     Comments: Lungs clear to auscultation bilaterally.  Symmetric chest rise.  No wheezing, rales, rhonchi. Abdominal:     Palpations: Abdomen is soft. Abdomen is not rigid.     Tenderness: There is no abdominal tenderness. There is no guarding.  Musculoskeletal: Normal range of motion.  Comments: Tenderness palpation noted to the right hip.  Limited range of motion secondary to pain.  No bony tenderness noted to right knee, right tib-fib.  Right lower extremity appears shortened.  Tenderness palpation in the left lower extremity. No tenderness to palpation to bilateral shoulders, clavicles, elbows, and wrists. No deformities or crepitus noted. FROM of BUE without difficulty.   Skin:    General: Skin is warm and dry.     Capillary Refill: Capillary refill takes less than 2 seconds.     Comments: Good distal cap refill. RLE is not dusky in appearance or cool to touch.  Neurological:     Mental Status: She is alert and oriented to person, place, and time.     Comments: Sensation intact along major nerve distributions of BLE  Psychiatric:        Speech: Speech normal.      ED Treatments / Results  Labs (all labs ordered are listed, but only abnormal results are displayed) Labs Reviewed  BASIC METABOLIC PANEL - Abnormal; Notable for the following components:      Result Value   Sodium 133 (*)    Potassium 3.1 (*)    Chloride 97 (*)    Glucose, Bld 163 (*)    Calcium 8.8 (*)    All other components within normal limits  CBC WITH DIFFERENTIAL/PLATELET - Abnormal; Notable for the following components:   WBC 11.4 (*)    Neutro Abs 10.2 (*)    Lymphs Abs 0.4 (*)    All  other components within normal limits  PROTIME-INR  MAGNESIUM  APTT  CK  TSH  URINALYSIS, ROUTINE W REFLEX MICROSCOPIC  CBC  BASIC METABOLIC PANEL  TYPE AND SCREEN    EKG EKG Interpretation  Date/Time:  Friday April 25 2018 17:58:36 EST Ventricular Rate:  87 PR Interval:    QRS Duration: 89 QT Interval:  351 QTC Calculation: 423 R Axis:   3 Text Interpretation:  Sinus rhythm Atrial premature complex Left ventricular hypertrophy Anterior Q waves, possibly due to LVH Borderline T abnormalities, inferior leads Confirmed by Lacretia Leigh ([id number]) on 04/25/2018 6:13:13 PM   Radiology Ct Head Wo Contrast  Result Date: 04/25/2018 CLINICAL DATA:  Fall with dementia EXAM: CT HEAD WITHOUT CONTRAST TECHNIQUE: Contiguous axial images were obtained from the base of the skull through the vertex without intravenous contrast. COMPARISON:  CT brain and cervical spine 02/02/2018 FINDINGS: Brain: No acute territorial infarction, hemorrhage or intracranial mass allowing for motion and patient positioning. Moderate-to-marked atrophy. Moderate severe small vessel ischemic changes of the white matter. Stable ventricle size. Vascular: No hyperdense vessel.  Carotid vascular calcification. Skull: Normal. Negative for fracture or focal lesion. Sinuses/Orbits: No acute finding. Other: None IMPRESSION: 1. Limited study secondary to motion degradation and patient positioning. 2. No definite CT evidence for acute intracranial abnormality. 3. Atrophy and small vessel ischemic changes of the white matter. Electronically Signed   By: Donavan Foil M.D.   On: 04/25/2018 19:16   Dg Hip Unilat W Or Wo Pelvis 2-3 Views Right  Result Date: 04/25/2018 CLINICAL DATA:  Hip pain EXAM: DG HIP (WITH OR WITHOUT PELVIS) 2-3V RIGHT COMPARISON:  None. FINDINGS: Fixating rod within the left femur. Old left superior and inferior pubic rami fractures. Acute fracture involving the base of the right femoral neck. No dislocation.  IMPRESSION: 1. Acute displaced fracture involving the base of the right femoral neck. 2. Old left superior and inferior pubic rami fractures. Electronically Signed   By:  Donavan Foil M.D.   On: 04/25/2018 18:50    Procedures Procedures (including critical care time)  Medications Ordered in ED Medications  potassium chloride 20 MEQ/15ML (10%) solution 40 mEq (has no administration in time range)  magnesium sulfate IVPB 1 g 100 mL (has no administration in time range)  oxyCODONE-acetaminophen (PERCOCET/ROXICET) 5-325 MG per tablet 1 tablet (1 tablet Oral Given 04/25/18 2016)  morphine 2 MG/ML injection 0.5 mg (0.5 mg Intravenous Given 04/25/18 2101)  methocarbamol (ROBAXIN) tablet 500 mg (has no administration in time range)  hydrALAZINE (APRESOLINE) injection 5 mg (has no administration in time range)  metoprolol tartrate (LOPRESSOR) injection 2.5 mg (has no administration in time range)  aspirin EC tablet 325 mg (has no administration in time range)  LORazepam (ATIVAN) tablet 0.25 mg (has no administration in time range)  polyethylene glycol (MIRALAX / GLYCOLAX) packet 17 g (has no administration in time range)  simethicone (MYLICON) 40 NI/7.7OE suspension 120 mg (has no administration in time range)  ferrous sulfate tablet 325 mg (has no administration in time range)  divalproex (DEPAKOTE) DR tablet 125 mg (has no administration in time range)  feeding supplement (PRO-STAT SUGAR FREE 64) liquid 30 mL (has no administration in time range)  sodium chloride tablet 1 g (has no administration in time range)  benzonatate (TESSALON) capsule 100 mg (has no administration in time range)  artificial tears (LACRILUBE) ophthalmic ointment 1 application (has no administration in time range)  diltiazem (CARDIZEM) tablet 30 mg (has no administration in time range)  ondansetron (ZOFRAN) injection 4 mg (has no administration in time range)  acetaminophen (TYLENOL) tablet 650 mg (has no administration in  time range)  zolpidem (AMBIEN) tablet 5 mg (has no administration in time range)  0.9 %  sodium chloride infusion (has no administration in time range)  fentaNYL (SUBLIMAZE) injection 100 mcg (100 mcg Intravenous Given 04/25/18 1742)  ondansetron (ZOFRAN) injection 4 mg (4 mg Intravenous Given 04/25/18 1809)  LORazepam (ATIVAN) 2 MG/ML injection (1 mg  Given 04/25/18 1800)  metoprolol tartrate (LOPRESSOR) injection 2.5 mg (2.5 mg Intravenous Given 04/25/18 1756)  potassium chloride 10 mEq in 100 mL IVPB (0 mEq Intravenous Stopped 04/25/18 2019)     Initial Impression / Assessment and Plan / ED Course  I have reviewed the triage vital signs and the nursing notes.  Pertinent labs & imaging results that were available during my care of the patient were reviewed by me and considered in my medical decision making (see chart for details).     84 year old female with known right hip fracture after a fall that occurred this morning.  Fall was unwitnessed.  Patient reports that she thinks her feet got tripped in the doorway.  X-ray performed at facility that showed a right intertrochanteric trochanteric fracture.  Unfortunately, we do not have access to the imaging therefore we will repeat it. Patient is afebrile, non-toxic appearing, sitting comfortably on examination table. Vital signs reviewed and stable.  Patient is neurovascularly intact.  On exam, patient with tenderness noted to the right hip.  Right lower extremity appears shortened.  Will obtain repeat x-ray, basic lab work.  RN informed me that patient's family was here.  I went to go discussed with family.  Patient just received fentanyl and then she started shaking.  She remained alert and awake during this episode.  No tonic-clonic seizing noted.  It appeared more to be shaking of her arms.  Her heart rate escalated into the 190s and she appeared  to be in A. fib.  She was given 1 mg of Ativan which helped with an additional 2.5 mg Lopressor  which brought her back to sinus rhythm.  BMP shows sodium 133, potassium is 3.1.  Bicarb is 26.  BUN and creatinine is normal. PT/INR is normal. CBC shows leukocytosis at 11.4.   XR shows acute displaced fracture involving the base of the right femoral neck.  Discussed patient with Dr. Lucia Gaskins (Ortho). Recommends medical admission with transfer to [facility] for surgical repair most likely tomorrow.   Discussed patient with Dr. Tomie China (hospitalist). Will accept patient for admission. Also discussed that patient will likely need cardiac evaluation for her paroxsymal afib.  Patient's POA is her daughter, Latanya Maudlin who can be reached at [phone].    Final Clinical Impressions(s) / ED Diagnoses   Final diagnoses:  Closed fracture of neck of right femur, initial encounter ([place])  Paroxysmal A-fib [facility])    ED Discharge Orders    None       Desma Mcgregor 04/26/18 0001    Lacretia Leigh, MD 04/30/18 [phone]

## 2018-04-25 NOTE — ED Provider Notes (Signed)
Medical screening examination/treatment/procedure(s) were conducted as a shared visit with non-physician practitioner(s) and myself.  I personally evaluated the patient during the encounter.  None Patient here for nursing home with no diagnosis of right hip fracture.  Developed A. fib after receiving fentanyl.  Patient given an Lopressor 2.5 mg IV push return to sinus rhythm.  Will admit for operative repair   Lacretia Leigh, MD 04/25/18 [phone]

## 2018-04-25 NOTE — ED Notes (Signed)
Patient transported to CT 

## 2018-04-25 NOTE — ED Notes (Signed)
Bed: CL27 Expected date:  Expected time:  Means of arrival:  Comments: Hip fx

## 2018-04-25 NOTE — ED Triage Notes (Signed)
Patient arrived via [facility] from [facility]. Patient fell around 11:30 am in bathroom. Walks with a walker. Prior hip replacement on left hip a couple months ago.  DG at facility positive for right hip fracture.  20g left ac NSR BP-186/106 Hr-84 RR-18 97%RA [id number]

## 2018-04-25 NOTE — H&P (Addendum)
History and Physical    Julie [name] NID:[id number] DOB: 1930-08-01 DOA: 04/25/2018  Referring MD/NP/PA:   PCP: Hayden Rasmussen, MD   Patient coming from:  The patient is coming from SNF.  At baseline, pt is dependent for most of ADL.        Chief Complaint: fall and right hip pain  HPI: Hero Mccathern is a 84 y.o. female with medical history significant of hypertension, cognitive decline, PR phenomenon anticoagulants, possible right breast cancer (patient elected to not to be worked up or treated), recent surgery for left hip fracture (doing rehab in SNF currently), who presents with fall and right hip pain.  Per her daughter, pt had an unwitnessed fall at about 11 AM this morning. Patient states that she was attempting get out of the bathroom that her feet got caught in the door, and fell.  She denies any preceding chest pain or dizziness.  Patient states that she did not hit her head or lose consciousness. She is unclear of how long she was sitting there. She developed sever pain in right hip. Her right leg is shortened and externally rotated.  X-ray was performed at facility, which showed right hip fracture, and was sent to ED for further evaluation and treatment.   She has previously had a left hip fracture surgery on 11/10. Her surgical site healed well.  She is doing rehab in facility.  Patient denies chest pain, shortness breath, cough, fever or chills.  No nausea vomiting, diarrhea, abdominal pain, symptoms of UTI.  No unilateral numbness or tingling his extremities.  No facial droop or slurred speech.  Per her daughter, pt has cognitive decline, normally she is only treated place and person, sometimes to time.  Patient is more drowsy today.  She is still oriented to place and person, but not to time.  Daughter states that her sedative medication Ativan was escalated, from 0.5 mg once a day to every 4 hour prn, and Depakote was added on since last night.  Patient had A. fib with RVR  initially, with heart rates up to 180s.  Patient was given 1 dose of Lopressor 2.5 mg by IV, converted to sinus rhythm.  Currently heart rate at 80s.  Denies chest pain or shortness of breath currently.  ED Course: pt was found to have WBC 11.4, INR 1.02, potassium 3.1, creatinine 0.45, BUN 27, O2 sat 84-98%, temperature 99.6, x-ray of right hip showed acute displaced fracture involving the base of the right femoral neck; and pld left superior and inferior pubic rami fractures.  CT head is a limited study, but did not show acute intracranial abnormalities.  Pt is admitted to telemetry bed as inpatient.  Orthopedic surgeon, Dr. Lucia Gaskins was consulted, likely to do surgery morning.  He asked to transfer the patient to [facility].  Review of Systems:   General: no fevers, chills, no body weight gain, has fatigue HEENT: no blurry vision, hearing changes or sore throat Respiratory: no dyspnea, coughing, wheezing CV: no chest pain, no palpitations GI: no nausea, vomiting, abdominal pain, diarrhea, constipation GU: no dysuria, burning on urination, increased urinary frequency, hematuria  Ext: no leg edema Neuro: no unilateral weakness, numbness, or tingling, no vision change or hearing loss. Had fall. Skin: no rash, no skin tear. MSK: has right hip pain Heme: No easy bruising.  Travel history: No recent long distant travel.  Allergy:  Allergies  Allergen Reactions  . Nitrofurantoin Monohyd Macro Nausea Only    Past Medical History:  Diagnosis Date  . Anxiety   . Cancer ([place])   . Hypertension     Past Surgical History:  Procedure Laterality Date  . INTRAMEDULLARY (IM) NAIL INTERTROCHANTERIC Left 03/23/2018   Procedure: INTRAMEDULLARY (IM) NAIL INTERTROCHANTRIC;  Surgeon: Dorna Leitz, MD;  Location: [facility];  Service: Orthopedics;  Laterality: Left;    Social History:  reports that she has never smoked. She has never used smokeless tobacco. She reports that she does not drink alcohol or  use drugs.  Family History:  Family History  Problem Relation Age of Onset  . Breast cancer Neg Hx      Prior to Admission medications   Medication Sig Start Date End Date Taking? Authorizing Provider  acetaminophen (TYLENOL) 325 MG tablet Take 2 tablets (650 mg total) by mouth every 6 (six) hours as needed for mild pain, fever or headache. 01/28/18  Yes Cherene Altes, MD  Amino Acids-Protein Hydrolys (FEEDING SUPPLEMENT, PRO-STAT SUGAR FREE 64,) LIQD Take 30 mLs by mouth 2 (two) times daily. 03/26/18  Yes Alma Friendly, MD  artificial tears (LACRILUBE) OINT ophthalmic ointment Place into both eyes every 8 (eight) hours. Patient taking differently: Place 1 application into both eyes every 8 (eight) hours.  01/28/18  Yes Cherene Altes, MD  aspirin EC 325 MG EC tablet Take 1 tablet (325 mg total) by mouth daily with breakfast. 03/27/18  Yes Alma Friendly, MD  benzonatate (TESSALON) 100 MG capsule Take 100 mg by mouth every 8 (eight) hours as needed for cough.   Yes [provider]  divalproex (DEPAKOTE) 125 MG DR tablet Take 125 mg by mouth at bedtime.   Yes [provider]  ferrous sulfate 325 (65 FE) MG tablet Take 1 tablet (325 mg total) by mouth daily with breakfast. 03/27/18  Yes Alma Friendly, MD  LORazepam (ATIVAN) 0.5 MG tablet Take 0.5 mg by mouth every 6 (six) hours.  03/18/18  Yes [provider]  polyethylene glycol (MIRALAX / GLYCOLAX) packet Take 17 g by mouth daily. Patient taking differently: Take 17 g by mouth daily as needed for mild constipation or moderate constipation.  03/27/18  Yes Alma Friendly, MD  potassium chloride (K-DUR) 10 MEQ tablet Take 10 mEq by mouth 2 (two) times daily. 03/14/18  Yes [provider]  simethicone (MYLICON) 40 JT/7.0VX SUSP Take 120 mg by mouth every 8 (eight) hours as needed for flatulence.   Yes [provider]  sodium chloride 1 g tablet Take 1 g by mouth 2 (two)  times daily.    Yes [provider]  traMADol (ULTRAM) 50 MG tablet Take 1 tablet (50 mg total) by mouth every 6 (six) hours as needed. Patient taking differently: Take 50 mg by mouth every 6 (six) hours as needed for moderate pain or severe pain.  03/23/18 03/23/19 Yes Gary Fleet, PA-C  senna-docusate (SENOKOT-S) 8.6-50 MG tablet Take 1 tablet by mouth 2 (two) times daily. Patient not taking: Reported on 04/25/2018 03/26/18   Alma Friendly, MD    Physical Exam: Vitals:   04/25/18 2130 04/25/18 2200 04/25/18 2300 04/25/18 2330  BP: 121/74 (!) 170/95 (!) 143/74 102/72  Pulse: 85 (!) 101 97 92  Resp: 10 (!) 24 10 11   Temp:      TempSrc:      SpO2: 98% 94% 91% (!) 84%   General: Not in acute distress. Thin body habitus  HEENT:       Eyes: PERRL, EOMI, no scleral  icterus.       ENT: No discharge from the ears and nose, no pharynx injection, no tonsillar enlargement.        Neck: No JVD, no bruit, no mass felt. Heme: No neck lymph node enlargement. Cardiac: S1/S2, RRR, No murmurs, No gallops or rubs. Respiratory:  No rales, wheezing, rhonchi or rubs. GI: Soft, nondistended, nontender, no rebound pain, no organomegaly, BS present. GU: No hematuria Ext: No pitting leg edema bilaterally. 2+DP/PT pulse bilaterally. Musculoskeletal: Has tenderness in  right hip.  Right leg is shortened and externally rotated. Skin: No rashes.  Neuro: Alert, oriented to place and person, but not to time, cranial nerves II-XII grossly intact, moves all extremities.  Psych: Patient is not psychotic, no suicidal or hemocidal ideation.  Labs on Admission: I have personally reviewed following labs and imaging studies  CBC: Recent Labs  Lab 04/25/18 1740  WBC 11.4*  NEUTROABS 10.2*  HGB 12.9  HCT 38.6  MCV 99.5  PLT 016   Basic Metabolic Panel: Recent Labs  Lab 04/25/18 1740  NA 133*  K 3.1*  CL 97*  CO2 26  GLUCOSE 163*  BUN 21  CREATININE 0.45  CALCIUM 8.8*   GFR: CrCl  cannot be calculated (Unknown ideal weight.). Liver Function Tests: No results for input(s): AST, ALT, ALKPHOS, BILITOT, PROT, ALBUMIN in the last 168 hours. No results for input(s): LIPASE, AMYLASE in the last 168 hours. No results for input(s): AMMONIA in the last 168 hours. Coagulation Profile: Recent Labs  Lab 04/25/18 1740  INR 1.02   Cardiac Enzymes: No results for input(s): CKTOTAL, CKMB, CKMBINDEX, TROPONINI in the last 168 hours. BNP (last 3 results) No results for input(s): PROBNP in the last 8760 hours. HbA1C: No results for input(s): HGBA1C in the last 72 hours. CBG: No results for input(s): GLUCAP in the last 168 hours. Lipid Profile: No results for input(s): CHOL, HDL, LDLCALC, TRIG, CHOLHDL, LDLDIRECT in the last 72 hours. Thyroid Function Tests: No results for input(s): TSH, T4TOTAL, FREET4, T3FREE, THYROIDAB in the last 72 hours. Anemia Panel: No results for input(s): VITAMINB12, FOLATE, FERRITIN, TIBC, IRON, RETICCTPCT in the last 72 hours. Urine analysis:    Component Value Date/Time   COLORURINE YELLOW 01/22/2018 1854   APPEARANCEUR HAZY (A) 01/22/2018 1854   LABSPEC 1.010 01/22/2018 1854   PHURINE 8.0 01/22/2018 1854   GLUCOSEU NEGATIVE 01/22/2018 1854   HGBUR NEGATIVE 01/22/2018 1854   BILIRUBINUR NEGATIVE 01/22/2018 1854   BILIRUBINUR NEG 07/19/2011 1606   KETONESUR 5 (A) 01/22/2018 1854   PROTEINUR NEGATIVE 01/22/2018 1854   UROBILINOGEN 0.2 07/19/2011 1606   NITRITE NEGATIVE 01/22/2018 1854   LEUKOCYTESUR NEGATIVE 01/22/2018 1854   Sepsis Labs: @LABRCNTIP (procalcitonin:4,lacticidven:4) )No results found for this or any previous visit (from the past 240 hour(s)).   Radiological Exams on Admission: Ct Head Wo Contrast  Result Date: 04/25/2018 CLINICAL DATA:  Fall with dementia EXAM: CT HEAD WITHOUT CONTRAST TECHNIQUE: Contiguous axial images were obtained from the base of the skull through the vertex without intravenous contrast. COMPARISON:   CT brain and cervical spine 02/02/2018 FINDINGS: Brain: No acute territorial infarction, hemorrhage or intracranial mass allowing for motion and patient positioning. Moderate-to-marked atrophy. Moderate severe small vessel ischemic changes of the white matter. Stable ventricle size. Vascular: No hyperdense vessel.  Carotid vascular calcification. Skull: Normal. Negative for fracture or focal lesion. Sinuses/Orbits: No acute finding. Other: None IMPRESSION: 1. Limited study secondary to motion degradation and patient positioning. 2. No definite CT evidence for acute  intracranial abnormality. 3. Atrophy and small vessel ischemic changes of the white matter. Electronically Signed   By: Donavan Foil M.D.   On: 04/25/2018 19:16   Dg Hip Unilat W Or Wo Pelvis 2-3 Views Right  Result Date: 04/25/2018 CLINICAL DATA:  Hip pain EXAM: DG HIP (WITH OR WITHOUT PELVIS) 2-3V RIGHT COMPARISON:  None. FINDINGS: Fixating rod within the left femur. Old left superior and inferior pubic rami fractures. Acute fracture involving the base of the right femoral neck. No dislocation. IMPRESSION: 1. Acute displaced fracture involving the base of the right femoral neck. 2. Old left superior and inferior pubic rami fractures. Electronically Signed   By: Donavan Foil M.D.   On: 04/25/2018 18:50     EKG: Independently reviewed.  Atrial fibrillation, QTC 411, T wave version in V4-V6, anteroseptal infarction pattern  Assessment/Plan Principal Problem:   Fracture of femoral neck, right, closed (HCC) Active Problems:   HTN (hypertension)   Cognitive decline   Hypokalemia   Protein-calorie malnutrition, severe   Atrial fibrillation with RVR (HCC)   Fall   Fracture of femoral neck, right, closed: As evidenced by x-ray. Patient has moderate pain now. No neurovascular compromise. Orthopedic surgeon, Dr Lucia Gaskins was consulted by EDP   - will admit to tele bed as inpt (transfer to [facility] per Dr. Lucia Gaskins) - Pain control: morphine prn and  percocet - When necessary Zofran for nausea - Robaxin for muscle spasm - type and cross - INR/PTT - PT/OT when able to (not ordered now)  Fall: seems to have had a Dealer fall. CT-head is a limited study, but did not show acute intracranial abnormalities. -PT OT when able to -check CK   HTN: bp 194/86-->150/64 in ED. No taking meds currently. Likely due to pain - started cardizem 30 mg bid which if A fib with RVR -IV hydralazine prn  Cognitive decline: mental status is slightly worse per her daughter, likely due to sedative meds esclation.  -Decrease Ativan from 0.5 every 6 hours to 0.25 twice daily prn -Frequent neuro check  Hypokalemia: K= 3.1 on admission. - Repleted - Check Mg level - Give 1 g of magnesium sulfate  Protein-calorie malnutrition, severe: -Nutrition consult  Atrial fibrillation with RVR [facility]): Patient had A. fib with RVR initially, with heart rates up to 180s.  Patient was given 1 dose of Lopressor 2.5 mg by IV, converted to sinus rhythm.  Currently heart rate at 80s.  Denies chest pain or shortness of breath currently. RVR is likely triggered by pain distress. CHA2DS2-VASc Score is 4, needs oral anticoagulation, but Patient is not on AC, likely due to high risk of fall.  -PRN Lopressor for heart rate >125 - Start Cardizem 30 mg twice daily -Check TSH -May need to get cardiology consult    Perioperative Cardiac Risk:  She has multiple comorbidities, including hypertension and PAF. Currently patient is in SNF for rehab from recent L hip fracture. She is dependent of his ADLs, IADLs. Patient does not have chest pain, shortness of breath, palpitation, leg edema.  No hx of CHF, but she has hx of A fib and transient RVR in ED, which has resolved with treatment now. His GUPTA score perioperative myocardial infarction or cardaic arrest is moderate at 3.12%. I discussed the risk with patient and her daughter.  They would like to pursue surgical treatment for her  right hip fracture.   Inpatient status:  # Patient requires inpatient status due to high intensity of service, high risk for  further deterioration and high frequency of surveillance required.  I certify that at the point of admission it is my clinical judgment that the patient will require inpatient hospital care spanning beyond 2 midnights from the point of admission.  . This patient has multiple chronic comorbidities including hypertension, cognitive decline, PR phenomenon anticoagulants, possible right breast cancer (patient elected to not to be worked up or treated), recent surgery for left hip fracture (doing rehab in SNF currently) . Now patient has presenting symptoms include fall and right hip pain . The worrisome physical exam findings include right hip fracture . The initial radiographic and laboratory data are worrisome because of right hip fracture, A fib with RVR, hypokalemia. . Current medical needs: please see my assessment and plan . Predictability of an adverse outcome (risk): Patient has multiple completely risk, now presents with right hip fracture, will need surgery, is at high risk for deteriorating, will need to stay in hospital for at least 2 days.     DVT ppx: SCD Code Status: DNR (pt has yellow paper document with DNR from SNF, which is confirmed by her daughter). Family Communication:   Yes, patient's daughter and grandson at bed side Disposition Plan:  Anticipate discharge back to SNF Consults called:  Ortho, dr. Lucia Gaskins Admission status:  Inpatient/tele      Date of Service 04/26/2018    Ivor Costa [facility] Pager [phone]  If 7PM-7AM, please contact night-coverage [email or web address] Password [facility] 04/26/2018, 12:37 AM

## 2018-04-26 ENCOUNTER — Inpatient Hospital Stay (HOSPITAL_COMMUNITY): Payer: Medicare Other

## 2018-04-26 ENCOUNTER — Inpatient Hospital Stay (HOSPITAL_COMMUNITY): Payer: Medicare Other | Admitting: Certified Registered"

## 2018-04-26 ENCOUNTER — Encounter (HOSPITAL_COMMUNITY)
Admission: EM | Disposition: A | Discharge status: Skilled Nursing Facility | Payer: Self-pay | Source: Skilled Nursing Facility | Attending: Internal Medicine

## 2018-04-26 DIAGNOSIS — W19XXXA Unspecified fall, initial encounter: Secondary | ICD-10-CM | POA: Diagnosis present

## 2018-04-26 HISTORY — PX: INTRAMEDULLARY (IM) NAIL INTERTROCHANTERIC: SHX5875

## 2018-04-26 LAB — ABO/RH: ABO/RH(D): AB POS

## 2018-04-26 LAB — URINALYSIS, ROUTINE W REFLEX MICROSCOPIC
Bilirubin Urine: NEGATIVE
Glucose, UA: NEGATIVE mg/dL
Hgb urine dipstick: NEGATIVE
Ketones, ur: NEGATIVE mg/dL
Leukocytes, UA: NEGATIVE
NITRITE: NEGATIVE
Protein, ur: NEGATIVE mg/dL
Specific Gravity, Urine: 1.015 (ref 1.005–1.030)
pH: 6 (ref 5.0–8.0)

## 2018-04-26 LAB — MRSA PCR SCREENING: MRSA by PCR: NEGATIVE

## 2018-04-26 SURGERY — FIXATION, FRACTURE, INTERTROCHANTERIC, WITH INTRAMEDULLARY ROD
Anesthesia: Spinal | Site: Hip | Laterality: Right

## 2018-04-26 MED ORDER — ONDANSETRON HCL 4 MG/2ML IJ SOLN: 4.0000 mg | Freq: Once | INTRAMUSCULAR | Status: DC | PRN

## 2018-04-26 MED ORDER — PROPOFOL 10 MG/ML IV BOLUS
INTRAVENOUS | Status: AC
  Filled 2018-04-26: qty 20

## 2018-04-26 MED ORDER — LIDOCAINE 2% (20 MG/ML) 5 ML SYRINGE
INTRAMUSCULAR | Status: AC
  Filled 2018-04-26: qty 5

## 2018-04-26 MED ORDER — FENTANYL CITRATE (PF) 100 MCG/2ML IJ SOLN: 25.0000 ug | INTRAMUSCULAR | Status: DC | PRN

## 2018-04-26 MED ORDER — FENTANYL CITRATE (PF) 250 MCG/5ML IJ SOLN
INTRAMUSCULAR | Status: AC
  Filled 2018-04-26: qty 5

## 2018-04-26 MED ORDER — EPINEPHRINE PF 1 MG/ML IJ SOLN
INTRAMUSCULAR | Status: AC
  Filled 2018-04-26: qty 1

## 2018-04-26 MED ORDER — ONDANSETRON HCL 4 MG/2ML IJ SOLN
INTRAMUSCULAR | Status: AC
  Filled 2018-04-26: qty 2

## 2018-04-26 MED ORDER — 0.9 % SODIUM CHLORIDE (POUR BTL) OPTIME
TOPICAL | Status: DC | PRN
  Administered 2018-04-26: 1000 mL

## 2018-04-26 MED ORDER — DEXAMETHASONE SODIUM PHOSPHATE 10 MG/ML IJ SOLN
INTRAMUSCULAR | Status: AC
  Filled 2018-04-26: qty 1

## 2018-04-26 MED ORDER — ROCURONIUM BROMIDE 50 MG/5ML IV SOSY
PREFILLED_SYRINGE | INTRAVENOUS | Status: AC
  Filled 2018-04-26: qty 5

## 2018-04-26 MED ORDER — LACTATED RINGERS IV SOLN
INTRAVENOUS | Status: DC | PRN
  Administered 2018-04-26: 08:00:00 via INTRAVENOUS

## 2018-04-26 MED ORDER — ALBUMIN HUMAN 5 % IV SOLN
INTRAVENOUS | Status: DC | PRN
  Administered 2018-04-26 (×2): via INTRAVENOUS

## 2018-04-26 MED ORDER — ACETAMINOPHEN 10 MG/ML IV SOLN
INTRAVENOUS | Status: AC
  Filled 2018-04-26: qty 200

## 2018-04-26 MED ORDER — CEFAZOLIN SODIUM-DEXTROSE 1-4 GM/50ML-% IV SOLN
1.0000 g | Freq: Three times a day (TID) | INTRAVENOUS | Status: AC
  Administered 2018-04-26 – 2018-04-27 (×3): 1 g via INTRAVENOUS
  Filled 2018-04-26 (×3): qty 50

## 2018-04-26 MED ORDER — CEFAZOLIN SODIUM-DEXTROSE 2-3 GM-%(50ML) IV SOLR
INTRAVENOUS | Status: DC | PRN
  Administered 2018-04-26: 2 g via INTRAVENOUS

## 2018-04-26 MED ORDER — WHITE PETROLATUM EX OINT
TOPICAL_OINTMENT | CUTANEOUS | Status: AC
  Administered 2018-04-26: 0.2
  Filled 2018-04-26: qty 28.35

## 2018-04-26 MED ORDER — BUPIVACAINE IN DEXTROSE 0.75-8.25 % IT SOLN
INTRATHECAL | Status: DC | PRN
  Administered 2018-04-26: 1.6 mL via INTRATHECAL

## 2018-04-26 MED ORDER — SODIUM CHLORIDE 0.9 % IV SOLN
INTRAVENOUS | Status: DC | PRN
  Administered 2018-04-26: 50 ug/min via INTRAVENOUS

## 2018-04-26 MED ORDER — PROPOFOL 500 MG/50ML IV EMUL
INTRAVENOUS | Status: DC | PRN
  Administered 2018-04-26: 10 ug/kg/min via INTRAVENOUS

## 2018-04-26 MED ORDER — PROPOFOL 10 MG/ML IV BOLUS
INTRAVENOUS | Status: DC | PRN
  Administered 2018-04-26 (×2): 10 mg via INTRAVENOUS

## 2018-04-26 SURGICAL SUPPLY — 64 items
BIT DRILL AO GAMMA 4.2X180 (BIT) ×4 IMPLANT
BLADE SAW SAG 73X25 THK (BLADE)
BLADE SAW SGTL 73X25 THK (BLADE) IMPLANT
BRUSH FEMORAL CANAL (MISCELLANEOUS) IMPLANT
BRUSH SCRUB SURG 4.25 DISP (MISCELLANEOUS) ×8 IMPLANT
COVER SURGICAL LIGHT HANDLE (MISCELLANEOUS) ×4 IMPLANT
COVER WAND RF STERILE (DRAPES) ×4 IMPLANT
DRAPE INCISE IOBAN 85X60 (DRAPES) ×4 IMPLANT
DRAPE ORTHO SPLIT 77X108 STRL (DRAPES) ×4
DRAPE STERI IOBAN 125X83 (DRAPES) ×4 IMPLANT
DRAPE SURG ORHT 6 SPLT 77X108 (DRAPES) ×4 IMPLANT
DRAPE U-SHAPE 47X51 STRL (DRAPES) ×4 IMPLANT
DRILL BIT 7/64X5 (BIT) ×4 IMPLANT
DRSG MEPILEX BORDER 4X4 (GAUZE/BANDAGES/DRESSINGS) ×8 IMPLANT
DRSG MEPILEX BORDER 4X8 (GAUZE/BANDAGES/DRESSINGS) ×4 IMPLANT
ELECT BLADE 6.5 EXT (BLADE) IMPLANT
ELECT CAUTERY BLADE 6.4 (BLADE) ×4 IMPLANT
ELECT REM PT RETURN 9FT ADLT (ELECTROSURGICAL) ×4
ELECTRODE REM PT RTRN 9FT ADLT (ELECTROSURGICAL) ×2 IMPLANT
GAUZE SPONGE 4X4 16PLY XRAY LF (GAUZE/BANDAGES/DRESSINGS) ×4 IMPLANT
GLOVE BIO SURGEON STRL SZ7.5 (GLOVE) ×4 IMPLANT
GLOVE BIO SURGEON STRL SZ8 (GLOVE) ×4 IMPLANT
GLOVE BIOGEL PI IND STRL 8 (GLOVE) ×4 IMPLANT
GLOVE BIOGEL PI INDICATOR 8 (GLOVE) ×4
GOWN STRL REUS W/ TWL LRG LVL3 (GOWN DISPOSABLE) ×4 IMPLANT
GOWN STRL REUS W/ TWL XL LVL3 (GOWN DISPOSABLE) ×2 IMPLANT
GOWN STRL REUS W/TWL 2XL LVL3 (GOWN DISPOSABLE) IMPLANT
GOWN STRL REUS W/TWL LRG LVL3 (GOWN DISPOSABLE) ×4
GOWN STRL REUS W/TWL XL LVL3 (GOWN DISPOSABLE) ×2
GUIDEROD T2 3X1000 (ROD) ×4 IMPLANT
HANDPIECE INTERPULSE COAX TIP (DISPOSABLE)
K-WIRE  3.2X450M STR (WIRE) ×2
K-WIRE 3.2X450M STR (WIRE) ×2
KIT BASIN OR (CUSTOM PROCEDURE TRAY) ×4 IMPLANT
KIT TURNOVER KIT B (KITS) ×4 IMPLANT
KWIRE 3.2X450M STR (WIRE) ×2 IMPLANT
MANIFOLD NEPTUNE II (INSTRUMENTS) IMPLANT
NAIL GAMMA LG R 5TI 10X360X125 (Nail) ×4 IMPLANT
NEEDLE 1/2 CIR MAYO (NEEDLE) IMPLANT
NS IRRIG 1000ML POUR BTL (IV SOLUTION) ×4 IMPLANT
PACK TOTAL JOINT (CUSTOM PROCEDURE TRAY) ×4 IMPLANT
PAD ARMBOARD 7.5X6 YLW CONV (MISCELLANEOUS) ×8 IMPLANT
PILLOW ABDUCTION HIP (SOFTGOODS) IMPLANT
PRESSURIZER FEMORAL UNIV (MISCELLANEOUS) IMPLANT
REAMER SHAFT BIXCUT (INSTRUMENTS) ×4 IMPLANT
RETRIEVER SUT HEWSON (MISCELLANEOUS) IMPLANT
SCREW LAG GAMMA 3 TI 10.5X85MM (Screw) ×4 IMPLANT
SCREW LOCKING T2 F/T  5X42.5MM (Screw) ×2 IMPLANT
SCREW LOCKING T2 F/T 5X42.5MM (Screw) ×2 IMPLANT
SET HNDPC FAN SPRY TIP SCT (DISPOSABLE) IMPLANT
STAPLER VISISTAT 35W (STAPLE) ×4 IMPLANT
SUT ETHILON 2 0 PSLX (SUTURE) ×8 IMPLANT
SUT FIBERWIRE #2 38 T-5 BLUE (SUTURE) ×8
SUT VIC AB 1 CT1 18XCR BRD 8 (SUTURE) ×2 IMPLANT
SUT VIC AB 1 CT1 27 (SUTURE) ×2
SUT VIC AB 1 CT1 27XBRD ANBCTR (SUTURE) ×2 IMPLANT
SUT VIC AB 1 CT1 8-18 (SUTURE) ×2
SUT VIC AB 2-0 CT1 27 (SUTURE) ×4
SUT VIC AB 2-0 CT1 TAPERPNT 27 (SUTURE) ×4 IMPLANT
SUTURE FIBERWR #2 38 T-5 BLUE (SUTURE) ×4 IMPLANT
TOWEL OR 17X24 6PK STRL BLUE (TOWEL DISPOSABLE) ×4 IMPLANT
TOWEL OR 17X26 10 PK STRL BLUE (TOWEL DISPOSABLE) ×4 IMPLANT
TOWER CARTRIDGE SMART MIX (DISPOSABLE) IMPLANT
WATER STERILE IRR 1000ML POUR (IV SOLUTION) ×4 IMPLANT

## 2018-04-26 NOTE — Transfer of Care (Signed)
Immediate Anesthesia Transfer of Care Note  Patient: Devan Babino  Procedure(s) Performed: INTRAMEDULLARY (IM) NAIL INTERTROCHANTRIC (Right Hip)  Patient Location: PACU  Anesthesia Type:MAC combined with regional for post-op pain  Level of Consciousness: sleepy as baseline in preop.  Airway & Oxygen Therapy: Patient Spontanous Breathing and Patient connected to face mask oxygen  Post-op Assessment: Report given to RN and Post -op Vital signs reviewed and stable  Post vital signs: Reviewed and stable  Last Vitals:  Vitals Value Taken Time  BP 108/51 04/26/2018  9:28 AM  Temp    Pulse 71 04/26/2018  9:30 AM  Resp 14 04/26/2018  9:30 AM  SpO2 100 % 04/26/2018  9:30 AM  Vitals shown include unvalidated device data.  Last Pain:  Vitals:   04/26/18 0350  TempSrc: Oral  PainSc:          Complications: No apparent anesthesia complications

## 2018-04-26 NOTE — Evaluation (Signed)
Physical Therapy Evaluation Patient Details Name: Julie [name] MRN: [id number] DOB: 1931-01-10 Today's Date: 04/26/2018   History of Present Illness  84 y.o. female with medical history significant of hypertension, cognitive decline, PR phenomenon anticoagulants, possible right breast cancer (patient elected to not to be worked up or treated), recent surgery for left hip fracture (doing rehab in SNF currently), who presents with fall and right hip pain with fracture now s/p IM nailing RLE.  Clinical Impression  Orders received for PT evaluation. Patient demonstrates deficits in functional mobility as indicated below. Will benefit from continued skilled PT to address deficits and maximize function. Will see as indicated and progress as tolerated.  Per daughter at bedside, patient at SNF for rehab/resident is hoping patient can return to her same room for resumption of therapies.    Follow Up Recommendations SNF;Supervision/Assistance - 24 hour(per daughter, desire is to return to her room at [facility] )    Equipment Recommendations  None recommended by PT    Recommendations for Other Services       Precautions / Restrictions Precautions Precautions: Fall Restrictions Weight Bearing Restrictions: Yes RLE Weight Bearing: Weight bearing as tolerated      Mobility  Bed Mobility Overal bed mobility: Needs Assistance Bed Mobility: Supine to Sit;Sit to Supine     Supine to sit: +2 for physical assistance;Max assist Sit to supine: +2 for physical assistance;Max assist   General bed mobility comments: Patient able to show some movement of LEs to EOB, max assist for trunk elevation and attention to task with multi modal cues to calm and reassure patient  Transfers                 General transfer comment: did not attempt  Ambulation/Gait             General Gait Details: NT  Stairs            Wheelchair Mobility    Modified Rankin (Stroke Patients Only)       Balance Overall balance assessment: Needs assistance;History of Falls Sitting-balance support: Feet supported Sitting balance-Leahy Scale: Poor(to fair) Sitting balance - Comments: fluctuating between self supported and assisted for ~6 mins at EOB                                     Pertinent Vitals/Pain Pain Assessment: Faces Faces Pain Scale: Hurts whole lot Pain Location: right LE Pain Descriptors / Indicators: Operative site guarding;Guarding;Grimacing;Moaning Pain Intervention(s): Monitored during session;Repositioned;Premedicated before session;Limited activity within patient's tolerance    Home Living Family/patient expects to be discharged to:: Skilled nursing facility                      Prior Function Level of Independence: Needs assistance   Gait / Transfers Assistance Needed: walked with A with RW at SNF  ADL's / Homemaking Assistance Needed: A with basic ADLs at SNF        Hand Dominance   Dominant Hand: Right    Extremity/Trunk Assessment   Upper Extremity Assessment Upper Extremity Assessment: Generalized weakness    Lower Extremity Assessment Lower Extremity Assessment: RLE deficits/detail RLE Deficits / Details: post operative RLE RLE: Unable to fully assess due to pain       Communication   Communication: No difficulties  Cognition Arousal/Alertness: Lethargic Behavior During Therapy: Anxious Overall Cognitive Status: History of cognitive impairments - at baseline  General Comments      Exercises     Assessment/Plan    PT Assessment Patient needs continued PT services  PT Problem List Decreased strength;Decreased activity tolerance;Decreased mobility;Decreased balance;Decreased cognition;Decreased coordination;Pain       PT Treatment Interventions DME instruction;Gait training;Functional mobility training;Therapeutic activities;Therapeutic  exercise;Balance training;Patient/family education;Modalities    PT Goals (Current goals can be found in the Care Plan section)  Acute Rehab PT Goals Patient Stated Goal: per family, to go back to her room at [facility] PT Goal Formulation: With family Time For Goal Achievement: 05/10/18 Potential to Achieve Goals: Good    Frequency Min 3X/week   Barriers to discharge Decreased caregiver support      Co-evaluation               AM-PAC PT "6 Clicks" Mobility  Outcome Measure Help needed turning from your back to your side while in a flat bed without using bedrails?: A Lot Help needed moving from lying on your back to sitting on the side of a flat bed without using bedrails?: A Lot Help needed moving to and from a bed to a chair (including a wheelchair)?: Total Help needed standing up from a chair using your arms (e.g., wheelchair or bedside chair)?: Total Help needed to walk in hospital room?: Total Help needed climbing 3-5 steps with a railing? : Total 6 Click Score: 8    End of Session Equipment Utilized During Treatment: Oxygen Activity Tolerance: Patient limited by pain Patient left: in bed;with call bell/phone within reach;with bed alarm set;with family/visitor present;with SCD's reapplied Nurse Communication: Mobility status PT Visit Diagnosis: Difficulty in walking, not elsewhere classified (R26.2);Pain Pain - Right/Left: Right Pain - part of body: Hip    Time: 1353-1410 PT Time Calculation (min) (ACUTE ONLY): 17 min   Charges:   PT Evaluation $PT Eval Moderate Complexity: 1 Mod          Alben Deeds, PT DPT  Board Certified Neurologic Specialist Acute Rehabilitation Services Pager [phone] Office [place] 04/26/2018, 2:28 PM

## 2018-04-26 NOTE — Op Note (Signed)
Julie [name] female 84 y.o. 04/26/2018  PreOperative Diagnosis: Right displaced comminuted intertrochanteric hip fracture  PostOperative Diagnosis: Same   Procedure(s) and Anesthesia Type: Cephalo-medullary NAIL INTERTROCHANTRIC hip fracture right - Spinal Use of intraoperative fluoroscopy  Surgeon: Erle Crocker   Assistants: none  Anesthesia: Spinal anesthesia  Findings: Right displaced comminuted intertrochanteric hip fracture Implants: Stryker gamma 3 nail 10 x 360 with 85 mm compression screw  Indications:84 y.o. female had a fall at home yesterday morning.  She had pain in her right hip.  She was brought to [facility] long emergency department diagnosed with a right intertrochanteric hip fracture.  Of note patient had similar fracture on the contralateral side about 1 month ago that was fixed by Dr. Dorna Leitz.  She was currently in rehab for this.  Given the displacement of her fracture and her baseline ambulatory status she was indicated for operative fixation.  Lengthy discussion was had with the patient's family as the patient is demented about risk, benefits and alternatives to surgery which included but were not limited to wound healing complications, infection, nonunion, malunion, need for second surgery, damage to surrounding structures, anesthetic risks and perioperative surgical risks including death.  After weighing these risks they opted to proceed with surgery.  Procedure Detail: The patient was identified in the preoperative holding area the right hip was marked by myself.  The consent was signed by myself and the patient's medical guardian her daughter.  The patient was taken to the operative suite and spinal anesthesia was given without difficulty.  She was moved over onto the Shopiere table with the right foot in the traction boot and the left leg strapped to the traction beam well-padded.  Then the C-arm was used to obtain AP and lateral fluoroscopic images.  Then  using gentle traction and internal rotation the fracture site was reduced through the fracture table.  The appropriate reduction was confirmed on fluoroscopy.  The right hip and right lower extremity was prepped in the usual sterile fashion and a surgical timeout was performed.  I began the surgery by placing the guide wire at the tip of the trochanter and confirmed this on fluoroscopy.  Then the guidewire was inserted into the tip of the trochanter down to the level of the lesser trochanter.  Then an incision was made in the skin about 3 cm.  The tissue protector and entry reamer was placed over the guidepin and the tip of the trocar was entered.  This was taken down to the level of the lesser trochanter.  The bead tip guidewire was then placed down the femoral canal and measured.  Measurement was 360 mm.  Then the 10 x 360 nail was placed into the femoral canal without difficulty.  Appropriate position was confirmed on AP and lateral radiographs.  Then the guide for the cephalo-medullary component was placed and a skin incision made and this was inserted down to bone.  Then the appropriate position of the guidepin was confirmed on AP and lateral radiographs and inserted.  Then measurement was taken for the appropriate length of the cephalo-medullary component.  This was found to be 85 mm.  Then using the reamer for the cephalo-medullary component this femoral neck and head portion was reamed to allow for placement of the screw.  Then the screw was placed without difficulty.  The locking mechanism was placed.  Appropriate position of the screw was center center in the head and confirmed on AP and lateral radiographs.  Then the  C arm was taken distally and perfect circles were obtained at the knee.  A single distal interlocking screw was placed without difficulty.  Then the wounds were irrigated copiously and the deep tissue was closed with 2-0 PDS the subcuticular tissue was closed with 2-0 Monocryl and staples  were used for the skin.  Of note intraoperative fluoroscopy was used throughout the case and the x-rays were judged for appropriate reduction, placement of nail and cephalo-medullary component.  She was then transferred back to her hospital bed and taken the PACU in stable condition.  Counts were correct at the end of the case.  There were no complications.    Post Op Instructions: Patient may be weightbearing as tolerated and ambulate with assistance We will check a CBC in the a.m. Likely discharge back to rehab She will follow-up with me in 2 weeks for suture removal and x-rays.  Tourniquette Time: None  Estimated Blood Loss:  200 mL         Drains: none  Blood Given: none         Specimens: none       Complications:  * No complications entered in OR log *         Disposition: PACU - hemodynamically stable.         Condition: stable

## 2018-04-26 NOTE — Anesthesia Procedure Notes (Signed)
Spinal  Patient location during procedure: OR Start time: 04/26/2018 7:55 AM End time: 04/26/2018 8:05 AM Staffing Anesthesiologist: Murvin Natal, MD Performed: anesthesiologist  Preanesthetic Checklist Completed: patient identified, surgical consent, pre-op evaluation, timeout performed, IV checked, risks and benefits discussed and monitors and equipment checked Spinal Block Patient position: sitting Prep: DuraPrep Patient monitoring: cardiac monitor, continuous pulse ox and blood pressure Approach: right paramedian Location: L4-5 Injection technique: single-shot Needle Needle type: Pencan  Needle gauge: 24 G Needle length: 9 cm Assessment Sensory level: T10 Additional Notes Functioning IV was confirmed and monitors were applied. Sterile prep and drape, including hand hygiene and sterile gloves were used. The patient was positioned and the spine was prepped. The skin was anesthetized with lidocaine.  Free flow of clear CSF was obtained prior to injecting local anesthetic into the CSF.  The spinal needle aspirated freely following injection.  The needle was carefully withdrawn.  The patient tolerated the procedure well.

## 2018-04-26 NOTE — ED Notes (Signed)
Pt transported to CT ?

## 2018-04-26 NOTE — Anesthesia Preprocedure Evaluation (Addendum)
Anesthesia Evaluation  Patient identified by MRN, date of birth, ID band Patient confused    Reviewed: Allergy & Precautions, NPO status , Patient's Chart, lab work & pertinent test results  Airway Mallampati: II  TM Distance: >3 FB Neck ROM: Full    Dental no notable dental hx.    Pulmonary neg pulmonary ROS,    Pulmonary exam normal breath sounds clear to auscultation       Cardiovascular hypertension, Normal cardiovascular exam Rhythm:Regular Rate:Normal  ECG: rate 87. Sinus rhythm Atrial premature complex Left ventricular hypertrophy  ECHO: LV EF: 55% -   60%   Neuro/Psych Anxiety Confused    GI/Hepatic negative GI ROS, Neg liver ROS,   Endo/Other  negative endocrine ROS  Renal/GU negative Renal ROS     Musculoskeletal negative musculoskeletal ROS (+)   Abdominal   Peds  Hematology negative hematology ROS (+)   Anesthesia Other Findings RIGHT FEMORAL NECK FRACTURE  Reproductive/Obstetrics                            Anesthesia Physical Anesthesia Plan  ASA: III  Anesthesia Plan: Spinal   Post-op Pain Management:    Induction:   PONV Risk Score and Plan: 2 and Ondansetron, Dexamethasone and Treatment may vary due to age or medical condition  Airway Management Planned: Natural Airway  Additional Equipment:   Intra-op Plan:   Post-operative Plan:   Informed Consent: I have reviewed the patients History and Physical, chart, labs and discussed the procedure including the risks, benefits and alternatives for the proposed anesthesia with the patient or authorized representative who has indicated his/her understanding and acceptance.   Dental advisory given  Plan Discussed with: CRNA  Anesthesia Plan Comments:         Anesthesia Quick Evaluation

## 2018-04-26 NOTE — Consult Note (Signed)
Reason for Consult:Right hip fracture Referring Physician: [facility]  Julie [name] is an 84 y.o. female.  HPI: Julie [name] suffered a fall yesterday and had right hip pain.  She was brought to the ER and diagnosed with a right hip fracture.  Patient recently fractured her left hip and November 2019 and underwent intramedullary nailing by Dr. Dorna Leitz.  She is done well from this.  She has been in rehab.  She fell yesterday while coming out of the bathroom.  Orthopedics was consulted.  On evaluation of the imaging it was difficult to tell if this was a femoral neck fracture versus an intertrochanteric fracture so CT scan was done.  This confirmed that it was an infected intertrochanteric fracture.  I had a long discussion with the patient's family discussing the proposed surgical procedure and they wish to proceed with surgery given her baseline ambulatory status.  Patient has had some cognitive decline recently and will require medical power of attorney for consent.  Past Medical History:  Diagnosis Date  . Anxiety   . Cancer ([place])   . Hypertension     Past Surgical History:  Procedure Laterality Date  . INTRAMEDULLARY (IM) NAIL INTERTROCHANTERIC Left 03/23/2018   Procedure: INTRAMEDULLARY (IM) NAIL INTERTROCHANTRIC;  Surgeon: Dorna Leitz, MD;  Location: [facility] ORS;  Service: Orthopedics;  Laterality: Left;    Family History  Problem Relation Age of Onset  . Breast cancer Neg Hx     Social History:  reports that she has never smoked. She has never used smokeless tobacco. She reports that she does not drink alcohol or use drugs.  Allergies:  Allergies  Allergen Reactions  . Nitrofurantoin Monohyd Macro Nausea Only    Medications: I have reviewed the patient's current medications.  Results for orders placed or performed during the hospital encounter of 04/25/18 (from the past 48 hour(s))  Basic metabolic panel     Status: Abnormal   Collection Time: 04/25/18  5:40 PM  Result Value  Ref Range   Sodium 133 (L) 135 - 145 mmol/L   Potassium 3.1 (L) 3.5 - 5.1 mmol/L   Chloride 97 (L) 98 - 111 mmol/L   CO2 26 22 - 32 mmol/L   Glucose, Bld 163 (H) 70 - 99 mg/dL   BUN 21 8 - 23 mg/dL   Creatinine, Ser 0.45 0.44 - 1.00 mg/dL   Calcium 8.8 (L) 8.9 - 10.3 mg/dL   GFR calc non Af Amer >60 >60 mL/min   GFR calc Af Amer >60 >60 mL/min   Anion gap 10 5 - 15    Comment: Performed at [facility], [place] [address], [place], [place] [address]  CBC WITH DIFFERENTIAL     Status: Abnormal   Collection Time: 04/25/18  5:40 PM  Result Value Ref Range   WBC 11.4 (H) 4.0 - 10.5 K/uL   RBC 3.88 3.87 - 5.11 MIL/uL   Hemoglobin 12.9 12.0 - 15.0 g/dL   HCT 38.6 36.0 - 46.0 %   MCV 99.5 80.0 - 100.0 fL   MCH 33.2 26.0 - 34.0 pg   MCHC 33.4 30.0 - 36.0 g/dL   RDW 13.3 11.5 - 15.5 %   Platelets 241 150 - 400 K/uL   nRBC 0.0 0.0 - 0.2 %   Neutrophils Relative % 90 %   Neutro Abs 10.2 (H) 1.7 - 7.7 K/uL   Lymphocytes Relative 4 %   Lymphs Abs 0.4 (L) 0.7 - 4.0 K/uL   Monocytes Relative  6 %   Monocytes Absolute 0.7 0.1 - 1.0 K/uL   Eosinophils Relative 0 %   Eosinophils Absolute 0.0 0.0 - 0.5 K/uL   Basophils Relative 0 %   Basophils Absolute 0.0 0.0 - 0.1 K/uL   Immature Granulocytes 0 %   Abs Immature Granulocytes 0.04 0.00 - 0.07 K/uL    Comment: Performed at [facility], [place] [address], [place], [place] [address]  Protime-INR     Status: None   Collection Time: 04/25/18  5:40 PM  Result Value Ref Range   Prothrombin Time 13.3 11.4 - 15.2 seconds   INR 1.02     Comment: Performed at [facility], [place] [address] [place], [place] [address]  Type and screen [place]     Status: None   Collection Time: 04/25/18  5:40 PM  Result Value Ref Range   ABO/RH(D) AB POS    Antibody Screen NEG    Sample Expiration      04/28/2018 Performed at [facility], [place] [address], [place],  [place] [address]   Type and screen [place]     Status: None   Collection Time: 04/26/18  4:30 AM  Result Value Ref Range   ABO/RH(D) AB POS    Antibody Screen NEG    Sample Expiration      04/29/2018 Performed at [facility], [place] [address], [place], [place] [address]   ABO/Rh     Status: None (Preliminary result)   Collection Time: 04/26/18  4:30 AM  Result Value Ref Range   ABO/RH(D)      AB POS Performed at [place] [address] [place], [place] [address]   Urinalysis, Routine w reflex microscopic     Status: None   Collection Time: 04/26/18  6:12 AM  Result Value Ref Range   Color, Urine YELLOW YELLOW   APPearance CLEAR CLEAR   Specific Gravity, Urine 1.015 1.005 - 1.030   pH 6.0 5.0 - 8.0   Glucose, UA NEGATIVE NEGATIVE mg/dL   Hgb urine dipstick NEGATIVE NEGATIVE   Bilirubin Urine NEGATIVE NEGATIVE   Ketones, ur NEGATIVE NEGATIVE mg/dL   Protein, ur NEGATIVE NEGATIVE mg/dL   Nitrite NEGATIVE NEGATIVE   Leukocytes, UA NEGATIVE NEGATIVE    Comment: Performed at [place] [address], [place], [place] [address]    Ct Head Wo Contrast  Result Date: 04/25/2018 CLINICAL DATA:  Fall with dementia EXAM: CT HEAD WITHOUT CONTRAST TECHNIQUE: Contiguous axial images were obtained from the base of the skull through the vertex without intravenous contrast. COMPARISON:  CT brain and cervical spine 02/02/2018 FINDINGS: Brain: No acute territorial infarction, hemorrhage or intracranial mass allowing for motion and patient positioning. Moderate-to-marked atrophy. Moderate severe small vessel ischemic changes of the white matter. Stable ventricle size. Vascular: No hyperdense vessel.  Carotid vascular calcification. Skull: Normal. Negative for fracture or focal lesion. Sinuses/Orbits: No acute finding. Other: None IMPRESSION: 1. Limited study secondary to motion degradation and patient positioning. 2. No definite CT evidence for acute  intracranial abnormality. 3. Atrophy and small vessel ischemic changes of the white matter. Electronically Signed   By: Donavan Foil M.D.   On: 04/25/2018 19:16   Ct Hip Right Wo Contrast  Result Date: 04/26/2018 CLINICAL DATA:  Right hip fracture EXAM: CT OF THE RIGHT HIP WITHOUT CONTRAST TECHNIQUE: Multidetector CT imaging of the right hip was performed according to the standard  protocol. Multiplanar CT image reconstructions were also generated. COMPARISON:  Radiographs from 04/25/2018 FINDINGS: Bones/Joint/Cartilage Mildly comminuted intertrochanteric hip fracture, with fracture planes involving the greater trochanter and upper margin of the lesser trochanter indicating involvement outside of the joint. There is also a femoral neck component laterally for example as shown on image 34/6. Varus angulation is observed. Chronic healed deformity of the left pubic rami from old fracture. Ligaments Suboptimally assessed by CT. Muscles and Tendons Expected edema along tissue planes in the vicinity of the fracture site. Soft tissues Distended urinary bladder with cystocele. Iliac atherosclerotic calcification. IMPRESSION: 1. Mildly comminuted intertrochanteric hip fracture with fracture planes involving the greater trochanter and upper margin of the lesser trochanter indicating involvement outside of the joint. However, there is also some extension into the lateral portion of the femoral neck indicating a component of intra-articular involvement. Varus angulation. 2. Distended urinary bladder with cystocele. 3. Atherosclerosis. 4. Old healed fractures of the left pubic rami with resulting deformities. Electronically Signed   By: Van Clines M.D.   On: 04/26/2018 00:45   Dg Hip Unilat W Or Wo Pelvis 2-3 Views Right  Result Date: 04/25/2018 CLINICAL DATA:  Hip pain EXAM: DG HIP (WITH OR WITHOUT PELVIS) 2-3V RIGHT COMPARISON:  None. FINDINGS: Fixating rod within the left femur. Old left superior and  inferior pubic rami fractures. Acute fracture involving the base of the right femoral neck. No dislocation. IMPRESSION: 1. Acute displaced fracture involving the base of the right femoral neck. 2. Old left superior and inferior pubic rami fractures. Electronically Signed   By: Donavan Foil M.D.   On: 04/25/2018 18:50    Review of Systems  Unable to perform ROS: Dementia   Blood pressure (!) 152/85, pulse 91, temperature 98 F (36.7 C), temperature source Oral, resp. rate 18, SpO2 95 %. Physical Exam  Constitutional: She appears well-nourished.  HENT:  Head: Normocephalic.  Eyes: Conjunctivae are normal.  Neck: Neck supple.  Cardiovascular:  Tachycardia and HTN  Respiratory: Effort normal.  GI: Soft.  Musculoskeletal:     Comments: Pain in right hip with attempted log roll. No tenderness to palpation right knee, leg or ankle.  Wiggles toes. Endorses SILT toes. Foot is WWP.   No evidence of BUE injury.      Assessment/Plan: Patient has a right intertrochanteric hip fracture.  She is ambulatory at baseline using a walker.  She recently did well after intertrochanteric nailing of her left femur fracture by Dorna Leitz in November 2019.  Currently her hemoglobin is 12.9.  We will plan for intertrochanteric fixation with a trochanteric fixation nail.  She will remain n.p.o. until the time of surgery.  The patient's family understands the risk, benefits and alternatives to surgery including wound healing complications, nonunion, malunion, need for second surgery, continued pain, damage to surrounding structures as well as the anesthetic risks and the perioperative risks including death.  The patient family consented to undergo surgical fixation.  Erle Crocker 04/26/2018, 6:55 AM

## 2018-04-26 NOTE — Progress Notes (Signed)
P.t. returned to [id number]

## 2018-04-26 NOTE — Anesthesia Postprocedure Evaluation (Signed)
Anesthesia Post Note  Patient: Julie [name]  Procedure(s) Performed: INTRAMEDULLARY (IM) NAIL INTERTROCHANTRIC (Right Hip)     Patient location during evaluation: PACU Anesthesia Type: Spinal Level of consciousness: awake Pain management: pain level controlled Vital Signs Assessment: post-procedure vital signs reviewed and stable Respiratory status: spontaneous breathing, respiratory function stable and patient connected to nasal cannula oxygen Cardiovascular status: blood pressure returned to baseline and stable Postop Assessment: no headache, no backache, no apparent nausea or vomiting and spinal receding Anesthetic complications: no    Last Vitals:  Vitals:   04/26/18 1030 04/26/18 1110  BP:  137/63  Pulse: 72 77  Resp:  16  Temp:    SpO2: 98% 100%    Last Pain:  Vitals:   04/26/18 0350  TempSrc: Oral  PainSc:                  Ryan P Ellender

## 2018-04-26 NOTE — Progress Notes (Signed)
Patient ID: Julie [name], female   DOB: 1930-06-01, 84 y.o.   MRN: [id number]  PROGRESS NOTE    Julie [name]  WNU:[id number] DOB: 03/07/1931 DOA: 04/25/2018 PCP: Julie Rasmussen, MD   Brief Narrative:  Patient is an 84 year old female with hypertension cognitive decline as well as breast cancer who also has significant anxiety disorder.  She is a resident of skilled nursing facility at [facility] who was brought in with fall which was witnessed as mechanical in nature.  She had x-ray at the facility showed right hip fracture and was sent to the ER for evaluation.  Patient on admission was having significant pain with cognitive decline.  Daughter is primary caregiver and primary historian.  She was immediately seen in the ER and evaluated by orthopedic surgery.  Patient scheduled for surgery and medicine admitted patient for medical management and surgical clearance.   Assessment & Plan:   Principal Problem:   Fracture of femoral neck, right, closed ([place]) Active Problems:   HTN (hypertension)   Cognitive decline   Hypokalemia   Protein-calorie malnutrition, severe   Atrial fibrillation with RVR ([place])   Fall   #1 right femoral neck fracture: Patient has had surgical reduction today.  She is postoperative day #0.  We will continue care according to orthopedics.  PT and OT will be maintained and continued with.  #2 cognitive decline: Partly due to medications.  She is apparently on 3 doses of Ativan a day.  We may have to decrease the dose of Ativan.  Per daughter she had no dementia all this started with the Ativan.  #3 hypokalemia: Replete potassium.  #4 history of atrial fibrillation with rapid ventricular response: Patient's heart rate currently stable.  Resume home regimen and monitor.  #5 hypertension: Blood pressure at this point is stable.  Continue close management.  #6 protein calorie malnutrition: This is severe in nature.  Continue dietary supplementation.   DVT prophylaxis:  SCD Code Status: DNR Family Communication: Daughter at bedside with grandson  Disposition Plan: Back to skilled nursing facility   Consultants:   Orthopedic surgery Dr. Lucia Gaskins  Procedures:   ORIF of the right hip  Antimicrobials:   Ancef 1 g every 8 hours day 1   Subjective: Patient is seen postoperatively.  She is having some mild swelling of her lower lip.  Daughter at bedside and reports patient's Ativan dose appears to be too much for her.  She has had issues with her cognition.  Usually very anxious but at this point not showing any anxiety.  Objective: Vitals:   04/26/18 0053 04/26/18 0350 04/26/18 0700 04/26/18 0928  BP: (!) 188/81 (!) 152/85  (!) 108/51  Pulse: 89 91  70  Resp:  18  10  Temp: 97.6 F (36.4 C) 98 F (36.7 C)    TempSrc: Oral Oral    SpO2: 95% 95%  100%  Weight:   40 kg     Intake/Output Summary (Last 24 hours) at 04/26/2018 0941 Last data filed at 04/26/2018 0347 Gross per 24 hour  Intake 1100 ml  Output 850 ml  Net 250 ml   Filed Weights   04/26/18 0700  Weight: 40 kg    Examination:  General exam: Appears calm and comfortable, not anxious and not communicating adequately.  Slightly enlarged lower lip but dry lips visible Respiratory system: Clear to auscultation. Respiratory effort normal. Cardiovascular system: S1 & S2 heard, RRR. No JVD, murmurs, rubs, gallops or clicks. No pedal edema. Gastrointestinal system: Abdomen  is nondistended, soft and nontender. No organomegaly or masses felt. Normal bowel sounds heard. Central nervous system: Alert and oriented. No focal neurological deficits. Extremities: Symmetric 5 x 5 power. Skin: No rashes, lesions or ulcers Psychiatry: Judgement and insight appear normal. Mood & affect appropriate.     Data Reviewed: I have personally reviewed following labs and imaging studies  CBC: Recent Labs  Lab 04/25/18 1740  WBC 11.4*  NEUTROABS 10.2*  HGB 12.9  HCT 38.6  MCV 99.5  PLT 263    Basic Metabolic Panel: Recent Labs  Lab 04/25/18 1740  NA 133*  K 3.1*  CL 97*  CO2 26  GLUCOSE 163*  BUN 21  CREATININE 0.45  CALCIUM 8.8*   GFR: Estimated Creatinine Clearance: 32.5 mL/min (by C-G formula based on SCr of 0.45 mg/dL). Liver Function Tests: No results for input(s): AST, ALT, ALKPHOS, BILITOT, PROT, ALBUMIN in the last 168 hours. No results for input(s): LIPASE, AMYLASE in the last 168 hours. No results for input(s): AMMONIA in the last 168 hours. Coagulation Profile: Recent Labs  Lab 04/25/18 1740  INR 1.02   Cardiac Enzymes: No results for input(s): CKTOTAL, CKMB, CKMBINDEX, TROPONINI in the last 168 hours. BNP (last 3 results) No results for input(s): PROBNP in the last 8760 hours. HbA1C: No results for input(s): HGBA1C in the last 72 hours. CBG: No results for input(s): GLUCAP in the last 168 hours. Lipid Profile: No results for input(s): CHOL, HDL, LDLCALC, TRIG, CHOLHDL, LDLDIRECT in the last 72 hours. Thyroid Function Tests: No results for input(s): TSH, T4TOTAL, FREET4, T3FREE, THYROIDAB in the last 72 hours. Anemia Panel: No results for input(s): VITAMINB12, FOLATE, FERRITIN, TIBC, IRON, RETICCTPCT in the last 72 hours. Sepsis Labs: No results for input(s): PROCALCITON, LATICACIDVEN in the last 168 hours.  Recent Results (from the past 240 hour(s))  MRSA PCR Screening     Status: None   Collection Time: 04/26/18  6:04 AM  Result Value Ref Range Status   MRSA by PCR NEGATIVE NEGATIVE Final    Comment:        The GeneXpert MRSA Assay (FDA approved for NASAL specimens only), is one component of a comprehensive MRSA colonization surveillance program. It is not intended to diagnose MRSA infection nor to guide or monitor treatment for MRSA infections. Performed at [facility], [place] [address], [place], [place] [address]          Radiology Studies: Ct Head Wo Contrast  Result Date: 04/25/2018 CLINICAL DATA:  Fall with  dementia EXAM: CT HEAD WITHOUT CONTRAST TECHNIQUE: Contiguous axial images were obtained from the base of the skull through the vertex without intravenous contrast. COMPARISON:  CT brain and cervical spine 02/02/2018 FINDINGS: Brain: No acute territorial infarction, hemorrhage or intracranial mass allowing for motion and patient positioning. Moderate-to-marked atrophy. Moderate severe small vessel ischemic changes of the white matter. Stable ventricle size. Vascular: No hyperdense vessel.  Carotid vascular calcification. Skull: Normal. Negative for fracture or focal lesion. Sinuses/Orbits: No acute finding. Other: None IMPRESSION: 1. Limited study secondary to motion degradation and patient positioning. 2. No definite CT evidence for acute intracranial abnormality. 3. Atrophy and small vessel ischemic changes of the white matter. Electronically Signed   By: Donavan Foil M.D.   On: 04/25/2018 19:16   Ct Hip Right Wo Contrast  Result Date: 04/26/2018 CLINICAL DATA:  Right hip fracture EXAM: CT OF THE RIGHT HIP WITHOUT CONTRAST TECHNIQUE: Multidetector CT imaging of the right hip was performed according to  the standard protocol. Multiplanar CT image reconstructions were also generated. COMPARISON:  Radiographs from 04/25/2018 FINDINGS: Bones/Joint/Cartilage Mildly comminuted intertrochanteric hip fracture, with fracture planes involving the greater trochanter and upper margin of the lesser trochanter indicating involvement outside of the joint. There is also a femoral neck component laterally for example as shown on image 34/6. Varus angulation is observed. Chronic healed deformity of the left pubic rami from old fracture. Ligaments Suboptimally assessed by CT. Muscles and Tendons Expected edema along tissue planes in the vicinity of the fracture site. Soft tissues Distended urinary bladder with cystocele. Iliac atherosclerotic calcification. IMPRESSION: 1. Mildly comminuted intertrochanteric hip fracture with  fracture planes involving the greater trochanter and upper margin of the lesser trochanter indicating involvement outside of the joint. However, there is also some extension into the lateral portion of the femoral neck indicating a component of intra-articular involvement. Varus angulation. 2. Distended urinary bladder with cystocele. 3. Atherosclerosis. 4. Old healed fractures of the left pubic rami with resulting deformities. Electronically Signed   By: Van Clines M.D.   On: 04/26/2018 00:45   Dg Hip Unilat W Or Wo Pelvis 2-3 Views Right  Result Date: 04/25/2018 CLINICAL DATA:  Hip pain EXAM: DG HIP (WITH OR WITHOUT PELVIS) 2-3V RIGHT COMPARISON:  None. FINDINGS: Fixating rod within the left femur. Old left superior and inferior pubic rami fractures. Acute fracture involving the base of the right femoral neck. No dislocation. IMPRESSION: 1. Acute displaced fracture involving the base of the right femoral neck. 2. Old left superior and inferior pubic rami fractures. Electronically Signed   By: Donavan Foil M.D.   On: 04/25/2018 18:50        Scheduled Meds: . [MAR Hold] artificial tears  1 application Both Eyes I7T  . [MAR Hold] aspirin  325 mg Oral Q breakfast  . [MAR Hold] diltiazem  30 mg Oral Q12H  . [MAR Hold] divalproex  125 mg Oral QHS  . [MAR Hold] feeding supplement (PRO-STAT SUGAR FREE 64)  30 mL Oral BID  . [MAR Hold] ferrous sulfate  325 mg Oral Q breakfast  . [MAR Hold] sodium chloride  1 g Oral BID   Continuous Infusions: . sodium chloride 75 mL/hr at 04/26/18 0116     LOS: 1 day    Time spent: 64 minutes    GARBA,LAWAL, MD [facility] Pager [phone]  If 7PM-7AM, please contact night-coverage [email or web address] Password [email or web address] 04/26/2018, 9:41 AM

## 2018-04-26 NOTE — Progress Notes (Signed)
Patient confused. I verified that armband and name and DOB matched what was on consent. Consent was a telephone consent with daughter that was completed prior to patient arrival to Short Stay.

## 2018-04-26 NOTE — Progress Notes (Signed)
Daughter is requesting a Pych consult to determine treatment for anxiety. CAPPS has increased ativan; however has caused increased confusion. mirtazapine worked well but depleted sodium levels.

## 2018-04-26 NOTE — Progress Notes (Signed)
Placed on hold - no transport help

## 2018-04-26 NOTE — ED Notes (Signed)
Pt  Report called  Care link called  Paper work complete

## 2018-04-27 MED ORDER — LORAZEPAM 0.5 MG PO TABS
0.2500 mg | ORAL_TABLET | Freq: Three times a day (TID) | ORAL | Status: DC
  Administered 2018-04-27 – 2018-04-28 (×3): 0.25 mg via ORAL
  Filled 2018-04-27 (×2): qty 1

## 2018-04-27 MED ORDER — ASPIRIN 325 MG PO TBEC: 325.0000 mg | DELAYED_RELEASE_TABLET | Freq: Every day | ORAL | 0 refills | Status: AC

## 2018-04-27 NOTE — Clinical Social Work Note (Signed)
Clinical Social Work Assessment  Patient Details  Name: Julie [name] MRN: [id number] Date of Birth: [birth year]  Date of referral:  04/27/18               Reason for consult:  Discharge Planning                Permission sought to share information with:  Guardian, Case Manager Permission granted to share information::  Yes, Verbal Permission Granted  Name::     Julie [name]  Agency::     Relationship::  Daughter  Contact Information:     Housing/Transportation Living arrangements for the past 2 months:  [place] of Information:  Adult Children Patient Interpreter Needed:  None Criminal Activity/Legal Involvement Pertinent to Current Situation/Hospitalization:  No - Comment as needed Significant Relationships:  Adult Children Lives with:  Facility Resident Do you feel safe going back to the place where you live?  Yes Need for family participation in patient care:  Yes (Comment)  Care giving concerns:  CSW spoke with patient's daughter due to the patient being confused. Daughter had concerns about her mother having anxiety and that it was resulted into her having her fall. Patient is a resident of [facility]. Patient's daughter is concerned that her mother needs a medication evaluation. Also, had a concern about her mother having a bad infection in the back of her throat. Would like her mother discharged back to [facility] once she is medically stable.    Social Worker assessment / plan:  CSW spoke with the daughter over the phone due to patient having confusion. CSW spoke with the daughter about possible options for a skilled nursing facility. Patient's daughter stated that her mother is already a residence of [facility] and would like her mother to go back to the facility.  CSW noted the concerns of the patient's daughter and stated that she will document them in the assessment.   Employment status:  Retired Nurse, adult PT Recommendations:  [place] / Referral to community resources:  [place]  Patient/Family's Response to care: Patient's daughter is very receptive to her mother receiving treatment. She is aware of her prognosis. She is in support of her mother having physical therapy at [facility] once she is medically ready.   Patient/Family's Understanding of and Emotional Response to Diagnosis, Current Treatment, and Prognosis: Family is very understanding and is aware of patients current condition.   Emotional Assessment Appearance:  Appears stated age Attitude/Demeanor/Rapport:  Unable to Assess Affect (typically observed):  Unable to Assess Orientation:  Oriented to Self Alcohol / Substance use:  Not Applicable Psych involvement (Current and /or in the community):  No (Comment)  Discharge Needs  Concerns to be addressed:  Decision making concerns Readmission within the last 30 days:  No Current discharge risk:  Dependent with Mobility Barriers to Discharge:  Continued Medical Work up   [employer], [place] 04/27/2018, 6:01 PM

## 2018-04-27 NOTE — Progress Notes (Signed)
Daughter states grandson will not be in p.t.'s room until 1000 04-28-2018; daughter will not be here until late afternoon. If any changes in status please call Daughter at [phone] (c)

## 2018-04-27 NOTE — Progress Notes (Signed)
Patient ID: Julie [name], female   DOB: Oct 09, 1930, 84 y.o.   MRN: [id number]  PROGRESS NOTE    Carlton Sweaney  FIE:[id number] DOB: 23-Feb-1931 DOA: 04/25/2018 PCP: Hayden Rasmussen, MD   Brief Narrative:  Patient is an 84 year old female with hypertension cognitive decline as well as breast cancer who also has significant anxiety disorder.  She is a resident of skilled nursing facility at [facility] who was brought in with fall which was witnessed as mechanical in nature.  She had x-ray at the facility showed right hip fracture and was sent to the ER for evaluation.  Patient on admission was having significant pain with cognitive decline.  Daughter is primary caregiver and primary historian.  She was immediately seen in the ER and evaluated by orthopedic surgery.  Patient has had surgery and medicine admitted patient for medical management and surgical clearance.   Assessment & Plan:   Principal Problem:   Fracture of femoral neck, right, closed ([place]) Active Problems:   HTN (hypertension)   Cognitive decline   Hypokalemia   Protein-calorie malnutrition, severe   Atrial fibrillation with RVR ([place])   Fall   #1 right femoral neck fracture: Postoperative day #1.  Patient is doing much better.  Continue with  PT and OT   #2 cognitive decline: Patient is more awake and alert today.  Engaged in conversation.  She has been on high-dose Ativan 0.5 mg every 6 hours at the facility.  Daughter believes this contributed to the fall and patient's decline in her cognitive abilities.  I concurred that this is high dose for this elderly woman.  She is currently on 0.25 mg every 8 hours as needed of the Ativan and has not had any since yesterday.  I am worried about withdrawal.  I will schedule patient on 0.25 mg 3 times daily for now with gradual discontinuation.  Daughter reported that she used to do very well on mirtazapine but due to persistent hyponatremia that was discontinued.  We will think of other  alternatives for the patient.  BuSpar may be something to do to significant anxiety which can be tried.  #3 hypokalemia: Replete potassium.  #4 history of atrial fibrillation with rapid ventricular response: Patient's heart rate currently stable.  Resume home regimen and monitor.  #5 hypertension: Blood pressure at this point is stable.  Continue close management.  #6 protein calorie malnutrition: This is severe in nature.  Continue dietary supplementation.  #7 right maxillary and mandibular pain: Suspected dental abscess.  Will check CT of the maxillary area.  Continue Ancef   DVT prophylaxis: SCD Code Status: DNR Family Communication: Daughter at bedside with grandson  Disposition Plan: Back to skilled nursing facility   Consultants:   Orthopedic surgery Dr. Lucia Gaskins  Procedures:   ORIF of the right hip  Antimicrobials:   Ancef 1 g every 8 hours day 1   Subjective: Patient is more awake today but has complaint of her lips being a little bit more so.  Suspected dental infection.  She has some maxillary area mild tenderness.  Also excessive secretions.  Objective: Vitals:   04/27/18 0816 04/27/18 1128 04/27/18 1459 04/27/18 1530  BP: (!) 155/62 (!) 130/93  131/62  Pulse: 77 80  83  Resp: 16 20  16   Temp: 98.7 F (37.1 C) (!) 97.5 F (36.4 C) 97.9 F (36.6 C) 97.7 F (36.5 C)  TempSrc: Oral Oral Oral Oral  SpO2: 100% 100%  93%  Weight:  Intake/Output Summary (Last 24 hours) at 04/27/2018 1900 Last data filed at 04/27/2018 0443 Gross per 24 hour  Intake -  Output 250 ml  Net -250 ml   Filed Weights   04/26/18 0700  Weight: 40 kg    Examination:  General exam: Appears calm and comfortable, mildly anxious but communicating adequately.  Slightly enlarged lower lip but dry lips visible Respiratory system: Clear to auscultation. Respiratory effort normal. Cardiovascular system: S1 & S2 heard, RRR. No JVD, murmurs, rubs, gallops or clicks. No pedal  edema. Gastrointestinal system: Abdomen is nondistended, soft and nontender. No organomegaly or masses felt. Normal bowel sounds heard. Central nervous system: Alert and oriented. No focal neurological deficits. Extremities: Symmetric 5 x 5 power. Skin: No rashes, lesions or ulcers Psychiatry: Judgement and insight appear normal. Mood & affect appropriate.     Data Reviewed: I have personally reviewed following labs and imaging studies  CBC: Recent Labs  Lab 04/25/18 1740  WBC 11.4*  NEUTROABS 10.2*  HGB 12.9  HCT 38.6  MCV 99.5  PLT 237   Basic Metabolic Panel: Recent Labs  Lab 04/25/18 1740  NA 133*  K 3.1*  CL 97*  CO2 26  GLUCOSE 163*  BUN 21  CREATININE 0.45  CALCIUM 8.8*   GFR: Estimated Creatinine Clearance: 32.5 mL/min (by C-G formula based on SCr of 0.45 mg/dL). Liver Function Tests: No results for input(s): AST, ALT, ALKPHOS, BILITOT, PROT, ALBUMIN in the last 168 hours. No results for input(s): LIPASE, AMYLASE in the last 168 hours. No results for input(s): AMMONIA in the last 168 hours. Coagulation Profile: Recent Labs  Lab 04/25/18 1740  INR 1.02   Cardiac Enzymes: No results for input(s): CKTOTAL, CKMB, CKMBINDEX, TROPONINI in the last 168 hours. BNP (last 3 results) No results for input(s): PROBNP in the last 8760 hours. HbA1C: No results for input(s): HGBA1C in the last 72 hours. CBG: No results for input(s): GLUCAP in the last 168 hours. Lipid Profile: No results for input(s): CHOL, HDL, LDLCALC, TRIG, CHOLHDL, LDLDIRECT in the last 72 hours. Thyroid Function Tests: No results for input(s): TSH, T4TOTAL, FREET4, T3FREE, THYROIDAB in the last 72 hours. Anemia Panel: No results for input(s): VITAMINB12, FOLATE, FERRITIN, TIBC, IRON, RETICCTPCT in the last 72 hours. Sepsis Labs: No results for input(s): PROCALCITON, LATICACIDVEN in the last 168 hours.  Recent Results (from the past 240 hour(s))  MRSA PCR Screening     Status: None    Collection Time: 04/26/18  6:04 AM  Result Value Ref Range Status   MRSA by PCR NEGATIVE NEGATIVE Final    Comment:        The GeneXpert MRSA Assay (FDA approved for NASAL specimens only), is one component of a comprehensive MRSA colonization surveillance program. It is not intended to diagnose MRSA infection nor to guide or monitor treatment for MRSA infections. Performed at [facility], [place] [address], [place], [place] [address]          Radiology Studies: Ct Hip Right Wo Contrast  Result Date: 04/26/2018 CLINICAL DATA:  Right hip fracture EXAM: CT OF THE RIGHT HIP WITHOUT CONTRAST TECHNIQUE: Multidetector CT imaging of the right hip was performed according to the standard protocol. Multiplanar CT image reconstructions were also generated. COMPARISON:  Radiographs from 04/25/2018 FINDINGS: Bones/Joint/Cartilage Mildly comminuted intertrochanteric hip fracture, with fracture planes involving the greater trochanter and upper margin of the lesser trochanter indicating involvement outside of the joint. There is also a femoral neck component laterally for  example as shown on image 34/6. Varus angulation is observed. Chronic healed deformity of the left pubic rami from old fracture. Ligaments Suboptimally assessed by CT. Muscles and Tendons Expected edema along tissue planes in the vicinity of the fracture site. Soft tissues Distended urinary bladder with cystocele. Iliac atherosclerotic calcification. IMPRESSION: 1. Mildly comminuted intertrochanteric hip fracture with fracture planes involving the greater trochanter and upper margin of the lesser trochanter indicating involvement outside of the joint. However, there is also some extension into the lateral portion of the femoral neck indicating a component of intra-articular involvement. Varus angulation. 2. Distended urinary bladder with cystocele. 3. Atherosclerosis. 4. Old healed fractures of the left pubic rami with resulting  deformities. Electronically Signed   By: Van Clines M.D.   On: 04/26/2018 00:45   Dg C-arm 1-60 Min  Result Date: 04/26/2018 CLINICAL DATA:  IM nailing of intertrochanteric femur fracture EXAM: DG C-ARM 61-120 MIN; OPERATIVE RIGHT HIP WITH PELVIS FLUOROSCOPY TIME:  1 minute, 18 seconds COMPARISON:  Right hip radiographs-04/25/2018 FINDINGS: 4 spot intraoperative fluoroscopic images of the right femur are provided for review. Images demonstrate the sequela of intramedullary rod fixation of the right femur and dynamic screw fixation of the right femoral neck. The distal end of the intramedullary femoral rod is transfixed with a single cancellous screw. Alignment appears near anatomic. Residual deformity about the right lesser trochanter. No definite new fracture. Adjacent vascular calcifications. Expected adjacent subcutaneous emphysema. No radiopaque foreign body. IMPRESSION: Post intramedullary rod fixation of comminuted intertrochanteric femur fracture without evidence of complication. Electronically Signed   By: Sandi Mariscal M.D.   On: 04/26/2018 10:56   Dg Hip Operative Unilat W Or W/o Pelvis Right  Result Date: 04/26/2018 CLINICAL DATA:  IM nailing of intertrochanteric femur fracture EXAM: DG C-ARM 61-120 MIN; OPERATIVE RIGHT HIP WITH PELVIS FLUOROSCOPY TIME:  1 minute, 18 seconds COMPARISON:  Right hip radiographs-04/25/2018 FINDINGS: 4 spot intraoperative fluoroscopic images of the right femur are provided for review. Images demonstrate the sequela of intramedullary rod fixation of the right femur and dynamic screw fixation of the right femoral neck. The distal end of the intramedullary femoral rod is transfixed with a single cancellous screw. Alignment appears near anatomic. Residual deformity about the right lesser trochanter. No definite new fracture. Adjacent vascular calcifications. Expected adjacent subcutaneous emphysema. No radiopaque foreign body. IMPRESSION: Post intramedullary rod  fixation of comminuted intertrochanteric femur fracture without evidence of complication. Electronically Signed   By: Sandi Mariscal M.D.   On: 04/26/2018 10:56        Scheduled Meds: . artificial tears  1 application Both Eyes C5E  . aspirin  325 mg Oral Q breakfast  . diltiazem  30 mg Oral Q12H  . divalproex  125 mg Oral QHS  . feeding supplement (PRO-STAT SUGAR FREE 64)  30 mL Oral BID  . ferrous sulfate  325 mg Oral Q breakfast  . sodium chloride  1 g Oral BID   Continuous Infusions: . sodium chloride 75 mL/hr at 04/26/18 2151     LOS: 2 days    Time spent: 47 minutes    Aedyn Mckeon,LAWAL, MD [facility] Pager [phone]  If 7PM-7AM, please contact night-coverage [email or web address] Password [email or web address] 04/27/2018, 7:00 PM

## 2018-04-27 NOTE — Progress Notes (Signed)
Subjective: 1 Day Post-Op Procedure(s) (LRB): INTRAMEDULLARY (IM) NAIL INTERTROCHANTRIC (Right)  Patient resting comfortably.  No family at bedside.  Objective: Vital signs in last 24 hours: Temp:  [97.3 F (36.3 C)-99.3 F (37.4 C)] 98.7 F (37.1 C) (12/15 0816) Pulse Rate:  [69-105] 77 (12/15 0816) Resp:  [8-21] 16 (12/15 0816) BP: (112-175)/(50-77) 155/62 (12/15 0816) SpO2:  [90 %-100 %] 100 % (12/15 0816)   Recent Labs    04/25/18 1740  HGB 12.9   Recent Labs    04/25/18 1740  WBC 11.4*  RBC 3.88  HCT 38.6  PLT 241    Patient resting. Dressing to the right hip and leg intact without evidence of saturation Right lower extremity normal resting posture  Assessment/Plan: 1 Day Post-Op Procedure(s) (LRB): INTRAMEDULLARY (IM) NAIL INTERTROCHANTRIC (Right)  Patient is doing well postoperative day 1.  Her dressing is intact. She will work with physical therapy. She is hoping to return to her prior memory care unit She will follow-up with me in 2 weeks for suture removal and x-rays. We will follow-up on morning CBC to ensure her hemoglobin level is appropriate. Okay to start chemoprophylaxis for DVT She will be discharged on 325 mg aspirin and it has been written for Would avoid narcotics in this demented patient.      Erle Crocker 04/27/2018, 9:30 AM

## 2018-04-27 NOTE — Progress Notes (Signed)
P.t. has pulled out IV and continues to pull on medical devices. Daughter called and is concerned that p.t. is coming off of the ativan to quick.

## 2018-04-28 ENCOUNTER — Encounter (HOSPITAL_COMMUNITY): Payer: Self-pay | Admitting: Orthopaedic Surgery

## 2018-04-28 ENCOUNTER — Inpatient Hospital Stay (HOSPITAL_COMMUNITY): Payer: Medicare Other

## 2018-04-28 ENCOUNTER — Other Ambulatory Visit: Payer: Self-pay

## 2018-04-28 DIAGNOSIS — D649 Anemia, unspecified: Secondary | ICD-10-CM

## 2018-04-28 DIAGNOSIS — Z7189 Other specified counseling: Secondary | ICD-10-CM

## 2018-04-28 DIAGNOSIS — W19XXXD Unspecified fall, subsequent encounter: Secondary | ICD-10-CM

## 2018-04-28 DIAGNOSIS — E43 Unspecified severe protein-calorie malnutrition: Secondary | ICD-10-CM

## 2018-04-28 LAB — COMPREHENSIVE METABOLIC PANEL
ALK PHOS: 107 U/L (ref 38–126)
ALT: 15 U/L (ref 0–44)
AST: 24 U/L (ref 15–41)
Albumin: 2.5 g/dL — ABNORMAL LOW (ref 3.5–5.0)
Anion gap: 10 (ref 5–15)
BUN: 18 mg/dL (ref 8–23)
CO2: 23 mmol/L (ref 22–32)
Calcium: 8.2 mg/dL — ABNORMAL LOW (ref 8.9–10.3)
Chloride: 98 mmol/L (ref 98–111)
Creatinine, Ser: 0.4 mg/dL — ABNORMAL LOW (ref 0.44–1.00)
GFR calc Af Amer: >60 mL/min (ref >60)
GFR calc non Af Amer: >60 mL/min (ref >60)
Glucose, Bld: 96 mg/dL (ref 70–99)
Potassium: 3.9 mmol/L (ref 3.5–5.1)
Sodium: 131 mmol/L — ABNORMAL LOW (ref 135–145)
Total Bilirubin: 0.7 mg/dL (ref 0.3–1.2)
Total Protein: 4.9 g/dL — ABNORMAL LOW (ref 6.5–8.1)

## 2018-04-28 LAB — CBC
HCT: 20.2 % — ABNORMAL LOW (ref 36.0–46.0)
HEMOGLOBIN: 6.6 g/dL — AB (ref 12.0–15.0)
MCH: 32.5 pg (ref 26.0–34.0)
MCHC: 32.7 g/dL (ref 30.0–36.0)
MCV: 99.5 fL (ref 80.0–100.0)
NRBC: 0 % (ref 0.0–0.2)
Platelets: 148 10*3/uL — ABNORMAL LOW (ref 150–400)
RBC: 2.03 MIL/uL — ABNORMAL LOW (ref 3.87–5.11)
RDW: 13.4 % (ref 11.5–15.5)
WBC: 8.8 10*3/uL (ref 4.0–10.5)

## 2018-04-28 LAB — IRON AND TIBC
Iron: 27 ug/dL — ABNORMAL LOW (ref 28–170)
Saturation Ratios: 11 % (ref 10.4–31.8)
TIBC: 248 ug/dL — ABNORMAL LOW (ref 250–450)
UIBC: 221 ug/dL

## 2018-04-28 LAB — HEMOGLOBIN AND HEMATOCRIT, BLOOD
HCT: 20.4 % — ABNORMAL LOW (ref 36.0–46.0)
Hemoglobin: 6.5 g/dL — CL (ref 12.0–15.0)

## 2018-04-28 LAB — VITAMIN B12: Vitamin B-12: 915 pg/mL — ABNORMAL HIGH (ref 180–914)

## 2018-04-28 LAB — PREPARE RBC (CROSSMATCH)

## 2018-04-28 LAB — FERRITIN: Ferritin: 203 ng/mL (ref 11–307)

## 2018-04-28 MED ORDER — SODIUM CHLORIDE 0.9 % IV SOLN
INTRAVENOUS | Status: AC
  Administered 2018-04-29: 03:00:00 via INTRAVENOUS

## 2018-04-28 MED ORDER — FUROSEMIDE 10 MG/ML IJ SOLN
20.0000 mg | Freq: Once | INTRAMUSCULAR | Status: AC
  Administered 2018-04-28: 20 mg via INTRAVENOUS
  Filled 2018-04-28: qty 4

## 2018-04-28 MED ORDER — ENSURE ENLIVE PO LIQD
237.0000 mL | Freq: Three times a day (TID) | ORAL | Status: DC
  Administered 2018-04-28 – 2018-04-30 (×4): 237 mL via ORAL

## 2018-04-28 MED ORDER — BACID PO TABS
2.0000 | ORAL_TABLET | Freq: Three times a day (TID) | ORAL | Status: DC
  Administered 2018-04-28 – 2018-04-30 (×6): 2 via ORAL
  Filled 2018-04-28 (×8): qty 2

## 2018-04-28 MED ORDER — LORAZEPAM 0.5 MG PO TABS
0.2500 mg | ORAL_TABLET | Freq: Three times a day (TID) | ORAL | Status: DC | PRN
  Administered 2018-04-28 – 2018-04-29 (×2): 0.25 mg via ORAL
  Filled 2018-04-28 (×2): qty 1

## 2018-04-28 MED ORDER — CLINDAMYCIN HCL 150 MG PO CAPS
150.0000 mg | ORAL_CAPSULE | Freq: Three times a day (TID) | ORAL | Status: DC
  Administered 2018-04-28 – 2018-04-30 (×6): 150 mg via ORAL
  Filled 2018-04-28 (×8): qty 1

## 2018-04-28 MED ORDER — SODIUM CHLORIDE 0.9% IV SOLUTION
Freq: Once | INTRAVENOUS | Status: AC
  Administered 2018-04-28: 13:00:00 via INTRAVENOUS

## 2018-04-28 MED ORDER — QUETIAPINE FUMARATE 25 MG PO TABS
25.0000 mg | ORAL_TABLET | Freq: Every day | ORAL | Status: DC
  Administered 2018-04-28: 25 mg via ORAL
  Filled 2018-04-28 (×2): qty 1

## 2018-04-28 NOTE — Progress Notes (Addendum)
PROGRESS NOTE    Julie [name]  HUD:[id number] DOB: 10-10-1930 DOA: 04/25/2018 PCP: Hayden Rasmussen, MD   Brief Narrative:  84 year old with history of essential hypertension, breast cancer, recent cognitive decline, anxiety comes from the rehab for evaluation of mechanical fall and right-sided hip pain.  Patient was recently discharged from here about a month ago after having a left hip fracture requiring intramedullary rod fixation November 2019.  And now presenting with right displaced comminuted inter trochanteric Fracture requiring surgical repair.  Patient underwent procedure on 12/14 and tolerated it well.  Had about 200 cc of blood loss during the procedure.   Assessment & Plan:   Principal Problem:   Fracture of femoral neck, right, closed ([place]) Active Problems:   HTN (hypertension)   Cognitive decline   Hypokalemia   Protein-calorie malnutrition, severe   Atrial fibrillation with RVR ([place])   Fall  Right femoral neck fracture, closed - Status post surgical repair on 12/15 -PT/OT, pain control -Supportive care - Holding off on DVT prophylaxis due to blood loss  Acute anemia, unknown etiology - Baseline hemoglobin appears to be around 12.0, this morning it dropped to 6.6 -No obvious signs of bleeding.  Surgical blood loss 200 cc - Will order 2 units of PRBC, Lasix 20 mg IV in between the units - Check posttransfusion hemoglobin  Neck pain with leaning towards one side - Per daughter and the patient this appears to be new.  Will get CT of the cervical neck to rule out any pathology.  Patient and the family does not want MRI as she is very claustrophobic and anxious.  No focal neurologic signs appreciated  Right-sided maxillary and mandibular pain Dental abscess -Patient has poor dentition.  CT of the maxillofacial region suggestive of possible abscess.  Started on clindamycin 3 times daily for 7 days, probiotic has been added  Dementia/cognitive decline Anxiety and  intermittent agitation - I had extensive discussion with the patient's daughter at bedside.  At this time I will make her low-dose Xanax only as needed.  Add Seroquel 25 mg at bedtime.  Closely monitor her electrolytes as previously patient has developed hyponatremia with mirtazapine.  Palliative care discussion - Given patient's chronic comorbidities and bilateral hip fractures recently, issues with falling and overall decline in general condition daughter is seeking to try and make her feel as comfortable as possible.  I would request palliative care consultation to help establish goals of care. Daughter is very thankful and appreciative of the care.  Severe protein calorie malnutrition -Continue dietary supplementation.  Nutrition consult  Atrial fibrillation -Hold off on aspirin 325 mg daily due to acute bleeding.   DVT prophylaxis: SCDs while actively bleeding Code Status: DNR Family Communication: Extensively spoke with the patient's daughter at bedside Disposition Plan: Maintain in hospital stay until her medical condition is stable.  Currently getting blood as well.  Consultants:   Orthopedic  Procedures:   Surgical repair of the right hip on 12/14  Antimicrobials:   Clindamycin   Subjective: Patient is awake alert and oriented.  She is laying deliberate towards the left side but no other focal neuro deficit.  Overall appears very weak and frail. Daughter is at bedside and is concerned about overall mother's health.  I had an extensive conversation regarding the recovery process especially after bilateral hip fracture within the month.  Daughter at this time would like palliative care consultation.  Review of Systems Otherwise negative except as per HPI, including: General = no fevers,  chills, dizziness, malaise, fatigue HEENT/EYES = negative for pain, redness, loss of vision, double vision, blurred vision, loss of hearing, sore throat, hoarseness,  dysphagia Cardiovascular= negative for chest pain, palpitation, murmurs, lower extremity swelling Respiratory/lungs= negative for shortness of breath, cough, hemoptysis, wheezing, mucus production Gastrointestinal= negative for nausea, vomiting,, abdominal pain, melena, hematemesis Genitourinary= negative for Dysuria, Hematuria, Change in Urinary Frequency MSK = Negative for arthralgia, myalgias, Back Pain, Joint swelling  Neurology= Negative for headache, seizures, numbness, tingling  Psychiatry= Negative for anxiety, depression, suicidal and homocidal ideation Allergy/Immunology= Medication/Food allergy as listed  Skin= Negative for Rash, lesions, ulcers, itching   Objective: Vitals:   04/28/18 0823 04/28/18 1124 04/28/18 1230 04/28/18 1310  BP: (!) 133/59 (!) 131/48 (!) 127/58 120/62  Pulse: 82 72  87  Resp: 16 16 20 20   Temp: 98.3 F (36.8 C) 98.9 F (37.2 C) 99.2 F (37.3 C) 99.1 F (37.3 C)  TempSrc: Axillary Oral Oral Oral  SpO2: 93% 99% 99%   Weight:        Intake/Output Summary (Last 24 hours) at 04/28/2018 1357 Last data filed at 04/28/2018 1310 Gross per 24 hour  Intake 170 ml  Output 650 ml  Net -480 ml   Filed Weights   04/26/18 0700  Weight: 40 kg    Examination:  General exam: Appears calm and comfortable, elderly frail appearing, bilateral temporal wasting, cachectic Very poor dentition Respiratory system: Clear to auscultation. Respiratory effort normal. Cardiovascular system: S1 & S2 heard, RRR. No JVD, murmurs, rubs, gallops or clicks. No pedal edema. Gastrointestinal system: Abdomen is nondistended, soft and nontender. No organomegaly or masses felt. Normal bowel sounds heard. Central nervous system: Alert and oriented. No focal neurological deficits. Extremities: Symmetric 4 x 5 power. Skin: No rashes, lesions or ulcers Psychiatry: Judgement and insight appear normal. Mood & affect appropriate.     Data Reviewed:   CBC: Recent Labs  Lab  04/25/18 1740 04/28/18 0318 04/28/18 0755  WBC 11.4* 8.8  --   NEUTROABS 10.2*  --   --   HGB 12.9 6.6* 6.5*  HCT 38.6 20.2* 20.4*  MCV 99.5 99.5  --   PLT 241 148*  --    Basic Metabolic Panel: Recent Labs  Lab 04/25/18 1740 04/28/18 0318  NA 133* 131*  K 3.1* 3.9  CL 97* 98  CO2 26 23  GLUCOSE 163* 96  BUN 21 18  CREATININE 0.45 0.40*  CALCIUM 8.8* 8.2*   GFR: Estimated Creatinine Clearance: 32.5 mL/min (A) (by C-G formula based on SCr of 0.4 mg/dL (L)). Liver Function Tests: Recent Labs  Lab 04/28/18 0318  AST 24  ALT 15  ALKPHOS 107  BILITOT 0.7  PROT 4.9*  ALBUMIN 2.5*   No results for input(s): LIPASE, AMYLASE in the last 168 hours. No results for input(s): AMMONIA in the last 168 hours. Coagulation Profile: Recent Labs  Lab 04/25/18 1740  INR 1.02   Cardiac Enzymes: No results for input(s): CKTOTAL, CKMB, CKMBINDEX, TROPONINI in the last 168 hours. BNP (last 3 results) No results for input(s): PROBNP in the last 8760 hours. HbA1C: No results for input(s): HGBA1C in the last 72 hours. CBG: No results for input(s): GLUCAP in the last 168 hours. Lipid Profile: No results for input(s): CHOL, HDL, LDLCALC, TRIG, CHOLHDL, LDLDIRECT in the last 72 hours. Thyroid Function Tests: No results for input(s): TSH, T4TOTAL, FREET4, T3FREE, THYROIDAB in the last 72 hours. Anemia Panel: Recent Labs    04/28/18 0755  VITAMINB12 915*  FERRITIN 203  TIBC 248*  IRON 27*   Sepsis Labs: No results for input(s): PROCALCITON, LATICACIDVEN in the last 168 hours.  Recent Results (from the past 240 hour(s))  MRSA PCR Screening     Status: None   Collection Time: 04/26/18  6:04 AM  Result Value Ref Range Status   MRSA by PCR NEGATIVE NEGATIVE Final    Comment:        The GeneXpert MRSA Assay (FDA approved for NASAL specimens only), is one component of a comprehensive MRSA colonization surveillance program. It is not intended to diagnose MRSA infection nor  to guide or monitor treatment for MRSA infections. Performed at [facility], [place] [address] [place], [place] [address]          Radiology Studies: Ct Maxillofacial Wo Contrast  Result Date: 04/28/2018 CLINICAL DATA:  Headache, dental and mastoid pain after fall at skilled nursing facility. EXAM: CT MAXILLOFACIAL WITHOUT CONTRAST TECHNIQUE: Multidetector CT imaging of the maxillofacial structures was performed. Multiplanar CT image reconstructions were also generated. COMPARISON:  None. FINDINGS: Moderately motion degraded examination. OSSEOUS: No acute facial fracture. The mandible is intact, the condyles are located. No destructive bony lesions. Severe LEFT upper cervical facet arthropathy. Tooth 5 periapical lucency. ORBITS: Ocular globes and orbital contents are normal. SINUSES: Paranasal sinuses are well aerated. Intact nasal septum is midline. Mastoid aircells are well aerated. SOFT TISSUES: No significant soft tissue swelling. No subcutaneous gas or radiopaque foreign bodies. Severe calcific atherosclerosis carotid bifurcations. Punctate LEFT parotid sialolith. LIMITED INTRACRANIAL: Nonacute. IMPRESSION: 1. Moderately motion degraded examination. 2. Tooth 5 periapical lucency seen with abscess or avulsion injury. Electronically Signed   By: Elon Alas M.D.   On: 04/28/2018 04:56        Scheduled Meds: . artificial tears  1 application Both Eyes X8P  . aspirin  325 mg Oral Q breakfast  . diltiazem  30 mg Oral Q12H  . divalproex  125 mg Oral QHS  . feeding supplement (ENSURE ENLIVE)  237 mL Oral TID BM  . feeding supplement (PRO-STAT SUGAR FREE 64)  30 mL Oral BID  . ferrous sulfate  325 mg Oral Q breakfast  . furosemide  20 mg Intravenous Once  . LORazepam  0.25 mg Oral TID  . sodium chloride  1 g Oral BID   Continuous Infusions: . sodium chloride Stopped (04/28/18 1250)     LOS: 3 days   Time spent= 45 mins    Herbert Marken Arsenio Loader, MD [facility] Pager [phone]   If 7PM-7AM, please contact night-coverage [email or web address] Password [email or web address] 04/28/2018, 1:57 PM

## 2018-04-28 NOTE — Care Management Important Message (Signed)
Important Message  Patient Details  Name: Julie [name] MRN: [id number] Date of Birth: 12/25/1930   Medicare Important Message Given:  Yes    Pollie Friar, RN 04/28/2018, 1:09 PM

## 2018-04-28 NOTE — Progress Notes (Signed)
Initial Nutrition Assessment  DOCUMENTATION CODES:   Underweight  INTERVENTION:  Provide Ensure Enlive po BID, each supplement provides 350 kcal and 20 grams of protein  Encourage adequate PO intake.  NUTRITION DIAGNOSIS:   Inadequate oral intake related to poor appetite as evidenced by meal completion < 25%.  GOAL:   Patient will meet greater than or equal to 90% of their needs  MONITOR:   PO intake, Labs, Weight trends, I & O's, Skin, Supplement acceptance  REASON FOR ASSESSMENT:   Consult Assessment of nutrition requirement/status  ASSESSMENT:   84 year old female with hypertension cognitive decline as well as breast cancer who also has significant anxiety disorder.  She is a resident of skilled nursing facility at [facility] who was brought in with fall which was witnessed as mechanical in nature.  She had x-ray at the facility showed right hip fracture   Procedure(12/14): Cephalo-medullary NAIL INTERTROCHANTRIC hip fracture right  Pt was asleep during time of visit and did not awaken to RD assessment. Family at bedside. Daughter reports pt with decreased appetite over the past 3 months and has been consuming only ~25% of food at meals. Usual body weight ~105-110 lb which daughter reports pt last weighed 1 year ago. Pt with a 16-20% weight loss in 1 year, which is significant for time frame, however no weight history on record to support reported weight loss. 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 RD to order nutritional supplements to aid in caloric and protein needs.   Family requesting pt to continue to rest. Unable to complete Nutrition-Focused physical exam at this time.   Labs and medications reviewed.   Diet Order:   Diet Order            Diet vegetarian Room service appropriate? Yes; Fluid consistency: Thin  Diet effective now              EDUCATION NEEDS:   Not appropriate for education at this time  Skin:  Skin  Assessment: Skin Integrity Issues: Skin Integrity Issues:: Incisions Incisions: R hip  Last BM:  Unknown  Height:   Ht Readings from Last 1 Encounters:  03/22/18 5\' 4"  (1.626 m)    Weight:   Wt Readings from Last 1 Encounters:  04/26/18 40 kg    Ideal Body Weight:  54.5 kg  BMI:  Body mass index is 15.14 kg/m.  Estimated Nutritional Needs:   Kcal:  1350-1550  Protein:  55-65 grams  Fluid:  >/= 1.5L/day    Corrin Parker, MS, RD, LDN Pager # [phone] After hours/ weekend pager # [phone]

## 2018-04-29 ENCOUNTER — Inpatient Hospital Stay (HOSPITAL_COMMUNITY): Payer: Medicare Other

## 2018-04-29 DIAGNOSIS — Z7189 Other specified counseling: Secondary | ICD-10-CM

## 2018-04-29 DIAGNOSIS — Z515 Encounter for palliative care: Secondary | ICD-10-CM

## 2018-04-29 DIAGNOSIS — S72001G Fracture of unspecified part of neck of right femur, subsequent encounter for closed fracture with delayed healing: Secondary | ICD-10-CM

## 2018-04-29 DIAGNOSIS — S72001D Fracture of unspecified part of neck of right femur, subsequent encounter for closed fracture with routine healing: Secondary | ICD-10-CM

## 2018-04-29 LAB — TYPE AND SCREEN
ABO/RH(D): AB POS
Antibody Screen: NEGATIVE
Unit division: 0
Unit division: 0

## 2018-04-29 LAB — BPAM RBC
Blood Product Expiration Date: 201912282359
Blood Product Expiration Date: 202001012359
ISSUE DATE / TIME: 201912161242
ISSUE DATE / TIME: 201912162031
Unit Type and Rh: 8400
Unit Type and Rh: 8400

## 2018-04-29 LAB — GLUCOSE, CAPILLARY
Glucose-Capillary: 97 mg/dL (ref 70–99)
Glucose-Capillary: 112 mg/dL — ABNORMAL HIGH (ref 70–99)

## 2018-04-29 LAB — CBC
HCT: 31.7 % — ABNORMAL LOW (ref 36.0–46.0)
Hemoglobin: 10.7 g/dL — ABNORMAL LOW (ref 12.0–15.0)
MCH: 28.9 pg (ref 26.0–34.0)
MCHC: 33.8 g/dL (ref 30.0–36.0)
MCV: 85.7 fL (ref 80.0–100.0)
Platelets: 165 10*3/uL (ref 150–400)
RBC: 3.7 MIL/uL — ABNORMAL LOW (ref 3.87–5.11)
RDW: 19.9 % — ABNORMAL HIGH (ref 11.5–15.5)
WBC: 7.2 10*3/uL (ref 4.0–10.5)
nRBC: 0 % (ref 0.0–0.2)

## 2018-04-29 LAB — BASIC METABOLIC PANEL
Anion gap: 13 (ref 5–15)
BUN: 13 mg/dL (ref 8–23)
CO2: 26 mmol/L (ref 22–32)
Calcium: 8.2 mg/dL — ABNORMAL LOW (ref 8.9–10.3)
Chloride: 95 mmol/L — ABNORMAL LOW (ref 98–111)
Creatinine, Ser: 0.55 mg/dL (ref 0.44–1.00)
GFR calc Af Amer: >60 mL/min (ref >60)
GFR calc non Af Amer: >60 mL/min (ref >60)
GLUCOSE: 103 mg/dL — AB (ref 70–99)
Potassium: 3.2 mmol/L — ABNORMAL LOW (ref 3.5–5.1)
Sodium: 134 mmol/L — ABNORMAL LOW (ref 135–145)

## 2018-04-29 LAB — HEMATOLOGY COMMENTS:

## 2018-04-29 LAB — FOLATE RBC
Folate, Hemolysate: 382.3 ng/mL
Folate, RBC: 1991 ng/mL (ref >498)
HEMATOCRIT: 19.2 % — AB (ref 34.0–46.6)

## 2018-04-29 LAB — MAGNESIUM: Magnesium: 1.6 mg/dL — ABNORMAL LOW (ref 1.7–2.4)

## 2018-04-29 MED ORDER — POTASSIUM CHLORIDE CRYS ER 20 MEQ PO TBCR
40.0000 meq | EXTENDED_RELEASE_TABLET | Freq: Once | ORAL | Status: AC
  Administered 2018-04-29: 40 meq via ORAL
  Filled 2018-04-29: qty 2

## 2018-04-29 MED ORDER — MIRTAZAPINE 15 MG PO TBDP
15.0000 mg | ORAL_TABLET | Freq: Every day | ORAL | Status: DC
  Filled 2018-04-29: qty 1

## 2018-04-29 MED ORDER — MAGNESIUM OXIDE 400 (241.3 MG) MG PO TABS
800.0000 mg | ORAL_TABLET | ORAL | Status: AC
  Administered 2018-04-29 (×3): 800 mg via ORAL
  Filled 2018-04-29 (×3): qty 2

## 2018-04-29 MED ORDER — DEXTROSE-NACL 5-0.45 % IV SOLN
INTRAVENOUS | Status: DC
  Administered 2018-04-29 – 2018-04-30 (×2): via INTRAVENOUS

## 2018-04-29 MED ORDER — CLONAZEPAM 0.125 MG PO TBDP
0.1250 mg | ORAL_TABLET | Freq: Two times a day (BID) | ORAL | Status: DC | PRN
  Administered 2018-04-30: 0.125 mg via ORAL
  Filled 2018-04-29: qty 1

## 2018-04-29 MED ORDER — CLONAZEPAM 0.125 MG PO TBDP: 0.1250 mg | ORAL_TABLET | Freq: Two times a day (BID) | ORAL | Status: DC | PRN

## 2018-04-29 NOTE — Care Management Important Message (Signed)
Important Message  Patient Details  Name: Julie [name] MRN: [id number] Date of Birth: 01-04-1931   Medicare Important Message Given:  Yes    Jory Tanguma 04/29/2018, 4:07 PM

## 2018-04-29 NOTE — Progress Notes (Signed)
Physical Therapy Treatment Patient Details Name: Julie [name] MRN: [id number] DOB: [birth year] Today's Date: 04/29/2018    History of Present Illness 84 y.o. female with medical history significant of hypertension, cognitive decline, PR phenomenon anticoagulants, possible right breast cancer (patient elected to not to be worked up or treated), recent surgery for left hip fracture (doing rehab in SNF currently), who presents with fall and right hip pain found to have fracture now s/p IM nailing.    PT Comments    Patient seen for mobility progression. Pt is making progress toward PT goals and tolerated stand pivot transfers X 2 with mod/max A +2. Pt is unable to progress to gait training this session. Continue to progress as tolerated with anticipated d/c to SNF for further skilled PT services.     Follow Up Recommendations  SNF;Supervision/Assistance - 24 hour     Equipment Recommendations  None recommended by PT    Recommendations for Other Services       Precautions / Restrictions Precautions Precautions: Fall Restrictions Weight Bearing Restrictions: Yes RLE Weight Bearing: Weight bearing as tolerated    Mobility  Bed Mobility Overal bed mobility: Needs Assistance Bed Mobility: Supine to Sit     Supine to sit: Max assist;HOB elevated     General bed mobility comments: cues for sequencing and assist to bring hips to EOB and elevate trunk into sitting upright; pt able to assist with L LE and uses rail   Transfers Overall transfer level: Needs assistance Equipment used: 2 person hand held assist;Rolling walker (2 wheeled) Transfers: Sit to/from [facility] Sit to Stand: Mod assist;+2 physical assistance Stand pivot transfers: Max assist;+2 safety/equipment       General transfer comment: cues for safe hand placement; assist to power up and for balance in standing; attempted use of RW for second stand pivot BSC to recliner but much safer with +2 hand  held    Ambulation/Gait             General Gait Details: attempted to take steps however pt unable    Stairs             Wheelchair Mobility    Modified Rankin (Stroke Patients Only)       Balance Overall balance assessment: Needs assistance;History of Falls Sitting-balance support: Feet supported Sitting balance-Leahy Scale: Fair     Standing balance support: Bilateral upper extremity supported Standing balance-Leahy Scale: Poor                              Cognition Arousal/Alertness: Awake/alert Behavior During Therapy: WFL for tasks assessed/performed Overall Cognitive Status: History of cognitive impairments - at baseline                                 General Comments: needs frequent reorienting to situation and time of day      Exercises      General Comments General comments (skin integrity, edema, etc.): pt has red spot on thoracic spine and RN notified and assessed and protective bandage placed      Pertinent Vitals/Pain Pain Assessment: Faces Faces Pain Scale: Hurts even more Pain Location: right hip with weight bearing  Pain Descriptors / Indicators: Guarding;Grimacing;Moaning;Sore Pain Intervention(s): Limited activity within patient's tolerance;Monitored during session;Repositioned    Home Living  Prior Function            PT Goals (current goals can now be found in the care plan section) Acute Rehab PT Goals Patient Stated Goal: per family, to go back to her room at [facility] Progress towards PT goals: Progressing toward goals    Frequency    Min 3X/week      PT Plan Current plan remains appropriate    Co-evaluation              AM-PAC PT "6 Clicks" Mobility   Outcome Measure  Help needed turning from your back to your side while in a flat bed without using bedrails?: A Lot Help needed moving from lying on your back to sitting on the side of a flat bed  without using bedrails?: A Lot Help needed moving to and from a bed to a chair (including a wheelchair)?: A Lot Help needed standing up from a chair using your arms (e.g., wheelchair or bedside chair)?: A Lot Help needed to walk in hospital room?: Total Help needed climbing 3-5 steps with a railing? : Total 6 Click Score: 10    End of Session Equipment Utilized During Treatment: Oxygen;Gait belt Activity Tolerance: Patient tolerated treatment well Patient left: with call bell/phone within reach;in chair;with chair alarm set Nurse Communication: Mobility status PT Visit Diagnosis: Difficulty in walking, not elsewhere classified (R26.2);Pain Pain - Right/Left: Right Pain - part of body: Hip     Time: 0929-1000 PT Time Calculation (min) (ACUTE ONLY): 31 min  Charges:  $Therapeutic Activity: 23-37 mins                     Earney Navy, PTA Acute Rehabilitation Services Pager: [phone] Office: [phone]     Darliss Cheney 04/29/2018, 10:50 AM

## 2018-04-29 NOTE — Plan of Care (Signed)
  Problem: General Medical Path Diagnosis Goal: Knowledge of Plan of Care will increase Outcome: Progressing   Problem: General Medical Path Diagnosis Goal: Knowledge of Plan of Care will increase Outcome: Progressing

## 2018-04-29 NOTE — Plan of Care (Signed)
  Problem: General Medical Path Diagnosis Goal: Knowledge of Plan of Care will increase Outcome: Progressing   Problem: Infection: Goal: Knowledge of Plan of Care will increase Outcome: Progressing   Problem: General Medical Path Diagnosis Goal: Knowledge of Plan of Care will increase Outcome: Progressing   Problem: Infection: Goal: Knowledge of Plan of Care will increase 04/29/2018 0522 by Georjean Mode, RN Outcome: Progressing 04/29/2018 0522 by Georjean Mode, RN Outcome: Progressing

## 2018-04-29 NOTE — Progress Notes (Addendum)
PROGRESS NOTE    Julie [name]  QVZ:[id number] DOB: 06-15-1930 DOA: 04/25/2018 PCP: Hayden Rasmussen, MD   Brief Narrative:  84 year old with history of essential hypertension, breast cancer, recent cognitive decline, anxiety comes from the rehab for evaluation of mechanical fall and right-sided hip pain.  Patient was recently discharged from here about a month ago after having a left hip fracture requiring intramedullary rod fixation November 2019.  And now presenting with right displaced comminuted inter trochanteric Fracture requiring surgical repair.  Patient underwent procedure on 12/14 and tolerated it well.  Had about 200 cc of blood loss during the procedure.  Required 2 units of PRBC transfusion on 12/16.   Assessment & Plan:   Principal Problem:   Fracture of femoral neck, right, closed ([place]) Active Problems:   HTN (hypertension)   Cognitive decline   Hypokalemia   Protein-calorie malnutrition, severe   Atrial fibrillation with RVR ([place])   Fall  Right femoral neck fracture, closed - Status post surgical repair on 12/15 -PT/OT, pain control -Supportive care - Hold off on DVT prophylaxis, will slowly resume over next 24 hours.  Acute anemia, unknown etiology - Improved with units of PRBC transfusion.  Hemoglobin today is 10.7.  No obvious signs of bleeding. - Check posttransfusion hemoglobin - Borderline iron deficiency, iron supplements and bowel regimen  Neck pain with leaning towards one side Subacute T2 body and right medial clavicle fracture Cervical neck arthropathy/degenerative disease with foraminal nerve impingement without upper extremity weakness or neurologic signs - Unable to get MRI of the brain due to claustrophobic and anxious patient. -CT of the cervical spine does show subacute T2 and right medial clavicle fractures.  Several areas of advanced degenerative disease and possibly nerve impingement.  No complains of focal weakness, numbness and tingling in  her upper extremities.  Right-sided maxillary and mandibular pain Dental abscess -Patient has poor dentition.  CT of the maxillofacial region suggestive of possible abscess.  Clindamycin 3 times daily started 12/16 for total of 7 days.  Last day 12/22.  Probiotic started  Dementia/cognitive decline Anxiety and intermittent agitation - I had extensive discussion with the patient's daughter at bedside.  At this time I will make her low-dose Xanax only as needed.  Added Seroquel 25 mg at bedtime.  Closely monitor her electrolytes as previously patient has developed hyponatremia with mirtazapine.  Palliative care discussion - Palliative care to see the patient today  Severe protein calorie malnutrition -Nutrition team has been consulted. Very poor po intake, started D51/2NS 50cc/hr x2 days. Accuchecks ac qhs  Atrial fibrillation -Hold off on aspirin 325 mg daily due to acute bleeding.  Hypokalemia/hypomagnesemia -Repletion ordered.   DVT prophylaxis: SCDs while actively bleeding Code Status: DNR Family Communication: None at bedside today Disposition Plan: Maintain hospital stay until she has safe disposition in place.  Currently she still needs closer monitoring in the hospital.  Consultants:   Orthopedic  Procedures:   Surgical repair of the right hip on 12/14  Antimicrobials:   Clindamycin   Subjective: Patient is still quite weak.  Slightly confused this morning but no other focal neurologic signs. Very poor po intake  Review of Systems Otherwise negative except as per HPI, including: General = no fevers, chills, dizziness, malaise, fatigue HEENT/EYES = negative for pain, redness, loss of vision, double vision, blurred vision, loss of hearing, sore throat, hoarseness, dysphagia Cardiovascular= negative for chest pain, palpitation, murmurs, lower extremity swelling Respiratory/lungs= negative for shortness of breath, cough, hemoptysis, wheezing, mucus  production Gastrointestinal= negative for nausea, vomiting,, abdominal pain, melena, hematemesis Genitourinary= negative for Dysuria, Hematuria, Change in Urinary Frequency MSK = Negative for arthralgia, myalgias, Back Pain, Joint swelling  Neurology= Negative for headache, seizures, numbness, tingling  Psychiatry= Negative for anxiety, depression, suicidal and homocidal ideation Allergy/Immunology= Medication/Food allergy as listed  Skin= Negative for Rash, lesions, ulcers, itching   Objective: Vitals:   04/28/18 2051 04/28/18 2109 04/28/18 2345 04/29/18 0736  BP: 133/73 (!) 160/78 132/82 (!) 170/82  Pulse: 93 88 (!) 101 91  Resp: 18 18 20 20   Temp: 98.3 F (36.8 C) 98.3 F (36.8 C) 98.2 F (36.8 C) 98.1 F (36.7 C)  TempSrc: Oral Oral Oral Oral  SpO2:    96%  Weight:        Intake/Output Summary (Last 24 hours) at 04/29/2018 1152 Last data filed at 04/29/2018 0500 Gross per 24 hour  Intake 709.82 ml  Output 2500 ml  Net -1790.18 ml   Filed Weights   04/26/18 0700  Weight: 40 kg    Examination:  Constitutional: NAD, calm, comfortable, elderly, frail, cachectic Eyes: PERRL, lids and conjunctivae normal ENMT: Mucous membranes are moist. Posterior pharynx clear of any exudate or lesions.Normal dentition.  Neck: normal, supple, no masses, no thyromegaly Respiratory: clear to auscultation bilaterally, no wheezing, no crackles. Normal respiratory effort. No accessory muscle use.  Cardiovascular: Regular rate and rhythm, no murmurs / rubs / gallops. No extremity edema. 2+ pedal pulses. No carotid bruits.  Abdomen: no tenderness, no masses palpated. No hepatosplenomegaly. Bowel sounds positive.  Musculoskeletal: no clubbing / cyanosis. No joint deformity upper and lower extremities. Good ROM, no contractures. Normal muscle tone.  Skin: no rashes, lesions, ulcers. No induration Neurologic: CN 2-12 grossly intact. Sensation intact, DTR normal. Strength 4/5 in all 4.   Psychiatric: Normal judgment and insight. Alert and oriented x 3. Normal mood.    Data Reviewed:   CBC: Recent Labs  Lab 04/25/18 1740 04/28/18 0318 04/28/18 0755 04/29/18 0208  WBC 11.4* 8.8  --  7.2  NEUTROABS 10.2*  --   --   --   HGB 12.9 6.6* 6.5* 10.7*  HCT 38.6 20.2* 20.4* 31.7*  MCV 99.5 99.5  --  85.7  PLT 241 148*  --  627   Basic Metabolic Panel: Recent Labs  Lab 04/25/18 1740 04/28/18 0318 04/29/18 0208  NA 133* 131* 134*  K 3.1* 3.9 3.2*  CL 97* 98 95*  CO2 26 23 26   GLUCOSE 163* 96 103*  BUN 21 18 13   CREATININE 0.45 0.40* 0.55  CALCIUM 8.8* 8.2* 8.2*  MG  --   --  1.6*   GFR: Estimated Creatinine Clearance: 32.5 mL/min (by C-G formula based on SCr of 0.55 mg/dL). Liver Function Tests: Recent Labs  Lab 04/28/18 0318  AST 24  ALT 15  ALKPHOS 107  BILITOT 0.7  PROT 4.9*  ALBUMIN 2.5*   No results for input(s): LIPASE, AMYLASE in the last 168 hours. No results for input(s): AMMONIA in the last 168 hours. Coagulation Profile: Recent Labs  Lab 04/25/18 1740  INR 1.02   Cardiac Enzymes: No results for input(s): CKTOTAL, CKMB, CKMBINDEX, TROPONINI in the last 168 hours. BNP (last 3 results) No results for input(s): PROBNP in the last 8760 hours. HbA1C: No results for input(s): HGBA1C in the last 72 hours. CBG: No results for input(s): GLUCAP in the last 168 hours. Lipid Profile: No results for input(s): CHOL, HDL, LDLCALC, TRIG, CHOLHDL, LDLDIRECT in the last 72  hours. Thyroid Function Tests: No results for input(s): TSH, T4TOTAL, FREET4, T3FREE, THYROIDAB in the last 72 hours. Anemia Panel: Recent Labs    04/28/18 0755  VITAMINB12 915*  FERRITIN 203  TIBC 248*  IRON 27*   Sepsis Labs: No results for input(s): PROCALCITON, LATICACIDVEN in the last 168 hours.  Recent Results (from the past 240 hour(s))  MRSA PCR Screening     Status: None   Collection Time: 04/26/18  6:04 AM  Result Value Ref Range Status   MRSA by PCR  NEGATIVE NEGATIVE Final    Comment:        The GeneXpert MRSA Assay (FDA approved for NASAL specimens only), is one component of a comprehensive MRSA colonization surveillance program. It is not intended to diagnose MRSA infection nor to guide or monitor treatment for MRSA infections. Performed at [facility], [place] [address] [place], [place] [address]          Radiology Studies: Ct Cervical Spine Wo Contrast  Result Date: 04/29/2018 CLINICAL DATA:  Chronic neck pain EXAM: CT CERVICAL SPINE WITHOUT CONTRAST TECHNIQUE: Multidetector CT imaging of the cervical spine was performed without intravenous contrast. Multiplanar CT image reconstructions were also generated. COMPARISON:  02/02/2018 FINDINGS: Alignment: Dextrocurvature. Skull base and vertebrae: T2 body fracture with horizontal sclerotic flexure plane following the inferior endplate which is mildly depressed. No significant retropulsion. The fracture has a subacute appearance. Medial right clavicular head fracture with impaction. There is callus formation compatible with nonacute timing. Soft tissues and spinal canal: No gross canal collection or soft tissue inflammation. Disc levels: C1-2: Advanced left facet spurring. Disc height loss may impinge on the C2 nerve root. C2-3: Left facet ankylosis.  No impingement C3-4: Advanced disc narrowing and uncovertebral ridging. Asymmetric left facet spurring. There is left more than right foraminal impingement C4-5: Advanced disc narrowing with uncovertebral ridging. Asymmetric left facet spurring. Moderate bilateral foraminal narrowing. C5-6: Advanced disc narrowing with asymmetric rightward endplate and uncovertebral ridging. Advanced right foraminal impingement. Patent canal C6-7: Advanced disc narrowing with mild ridging. Mild to moderate foraminal narrowing greater on the right. C7-T1:Facet degeneration and disc narrowing with slight anterolisthesis. No evident impingement Upper chest:  Extensive atherosclerotic calcification. IMPRESSION: 1. Subacute appearing T2 body and right medial clavicle fractures. 2. Advanced disc and facet degeneration with foraminal impingement most notable at C3-4 bilaterally, C4-5 bilaterally, and C5-6 on the right. 3. Advanced left-sided facet spurring at C1-2 with probable C2 impingement. Electronically Signed   By: Monte Fantasia M.D.   On: 04/29/2018 09:44   Ct Maxillofacial Wo Contrast  Result Date: 04/28/2018 CLINICAL DATA:  Headache, dental and mastoid pain after fall at skilled nursing facility. EXAM: CT MAXILLOFACIAL WITHOUT CONTRAST TECHNIQUE: Multidetector CT imaging of the maxillofacial structures was performed. Multiplanar CT image reconstructions were also generated. COMPARISON:  None. FINDINGS: Moderately motion degraded examination. OSSEOUS: No acute facial fracture. The mandible is intact, the condyles are located. No destructive bony lesions. Severe LEFT upper cervical facet arthropathy. Tooth 5 periapical lucency. ORBITS: Ocular globes and orbital contents are normal. SINUSES: Paranasal sinuses are well aerated. Intact nasal septum is midline. Mastoid aircells are well aerated. SOFT TISSUES: No significant soft tissue swelling. No subcutaneous gas or radiopaque foreign bodies. Severe calcific atherosclerosis carotid bifurcations. Punctate LEFT parotid sialolith. LIMITED INTRACRANIAL: Nonacute. IMPRESSION: 1. Moderately motion degraded examination. 2. Tooth 5 periapical lucency seen with abscess or avulsion injury. Electronically Signed   By: Elon Alas M.D.   On: 04/28/2018 04:56  Scheduled Meds: . artificial tears  1 application Both Eyes G9M  . clindamycin  150 mg Oral Q8H  . diltiazem  30 mg Oral Q12H  . divalproex  125 mg Oral QHS  . feeding supplement (ENSURE ENLIVE)  237 mL Oral TID BM  . feeding supplement (PRO-STAT SUGAR FREE 64)  30 mL Oral BID  . ferrous sulfate  325 mg Oral Q breakfast  . lactobacillus  acidophilus  2 tablet Oral TID  . magnesium oxide  800 mg Oral Q4H  . QUEtiapine  25 mg Oral QHS  . sodium chloride  1 g Oral BID   Continuous Infusions:    LOS: 4 days   Time spent= 35 mins    Luwanda Starr Arsenio Loader, MD [facility] Pager [phone]   If 7PM-7AM, please contact night-coverage [email or web address] Password [email or web address] 04/29/2018, 11:52 AM

## 2018-04-29 NOTE — Consult Note (Signed)
Consultation Note Date: 04/29/2018   Patient Name: Julie [name]  DOB: 28-Jun-1930  MRN: [id number]  Age / Sex: 84 y.o., female  PCP: Julie Rasmussen, MD Referring Physician: Damita Lack, MD  Reason for Consultation: Establishing goals of care  HPI/Patient Profile: 84 y.o. female  with past medical history of hypertension, cognitive decline, possible right breast cancer (patient elected to not to be worked up or treated), recent surgery 03/23/18 for left hip fracture (doing rehab in [facility] currently) admitted on 04/25/2018 with fall at SNF with resulting right hip fracture, sx repair 04/26/18. Also found to have likely maxillofacial abscess being treated with Clindamycin. Has had many medication changes during hospitalization for underlying agitation/anxiety.   Clinical Assessment and Goals of Care: I met today with daughter, Julie [name], Julie [name] recognizes that her mother is failing to thrive and that her time is very limited. We discussed GOC and Julie [name] is very clear in her wishes:  - Return to SNF with hospice care - Goal is comfort and priority is managing anxiety - No desire for rehab or even making patient get out of bed - Hope to provide relief from anxiety/OCD to have comfort/rest in final days  I provided MOST form and discussed and Julie [name] plans to discuss with her sisters. Julie [name] says that her mother decided DNR and has been clear in her Living Will regarding her wishes. Julie [name] knows that her mother would not want her life to be prolonged in this state. They are interested in having comfort care provided at SNF. They would like for her anxiety to be managed with hopes of no further hyponatremia and of her being more alert. However, she would rather her be asleep than anxious. She also believes that klonopin/mirtazapine worked best for her mother and would like to continue knowing the risk that this  may cause hyponatremia. I also confirmed that IF her mother develops hyponatremia then they would focus on comfort and not aggressive treatment.   Primary Decision Maker HCPOA daughter Julie [name] and then Julie [name] Julie [name] is unavailable until this weekend)     SUMMARY OF RECOMMENDATIONS   - Better manage anxiety/OCD - Return to SNF with hospice when symptoms better managed  Code Status/Advance Care Planning:  DNR   Symptom Management:   Anxiety: Mirtazapine 15 mg qhs. Klonopin 0.125 mg BID prn. Seroquel has been added previously at 25 mg qhs. May consider increasing Seroquel to BID OR d/c Seroquel and trying Zyprexa 2.5 mg qhs.   Palliative Prophylaxis:   Delirium Protocol, Frequent Pain Assessment, Oral Care and Turn Reposition  Additional Recommendations (Limitations, Scope, Preferences):  No Artificial Feeding  Psycho-social/Spiritual:   Desire for further Chaplaincy support:yes  Additional Recommendations: Caregiving  Support/Resources, Education on Hospice and Grief/Bereavement Support  Prognosis:   < 3 months  Discharge Planning: [facility] with Hospice      Primary Diagnoses: Present on Admission: . Hypokalemia . HTN (hypertension) . Fracture of femoral neck, right, closed ([place]) . Atrial fibrillation with RVR ([place]) . Cognitive decline .  Protein-calorie malnutrition, severe . Fall   I have reviewed the medical record, interviewed the patient and family, and examined the patient. The following aspects are pertinent.  Past Medical History:  Diagnosis Date  . Anxiety   . Cancer ([place])   . Hypertension    Social History   Socioeconomic History  . Marital status: Divorced    Spouse name: Not on file  . Number of children: Not on file  . Years of education: Not on file  . Highest education level: Not on file  Occupational History  . Not on file  Social Needs  . Financial resource strain: Not on file  . Food insecurity:    Worry: Not on  file    Inability: Not on file  . Transportation needs:    Medical: Not on file    Non-medical: Not on file  Tobacco Use  . Smoking status: Never Smoker  . Smokeless tobacco: Never Used  Substance and Sexual Activity  . Alcohol use: Never    Frequency: Never  . Drug use: Never  . Sexual activity: Not Currently  Lifestyle  . Physical activity:    Days per week: Not on file    Minutes per session: Not on file  . Stress: Not on file  Relationships  . Social connections:    Talks on phone: Not on file    Gets together: Not on file    Attends religious service: Not on file    Active member of club or organization: Not on file    Attends meetings of clubs or organizations: Not on file    Relationship status: Not on file  Other Topics Concern  . Not on file  Social History Narrative  . Not on file   Family History  Problem Relation Age of Onset  . Breast cancer Neg Hx    Scheduled Meds: . artificial tears  1 application Both Eyes O8T  . clindamycin  150 mg Oral Q8H  . diltiazem  30 mg Oral Q12H  . divalproex  125 mg Oral QHS  . feeding supplement (ENSURE ENLIVE)  237 mL Oral TID BM  . feeding supplement (PRO-STAT SUGAR FREE 64)  30 mL Oral BID  . ferrous sulfate  325 mg Oral Q breakfast  . lactobacillus acidophilus  2 tablet Oral TID  . magnesium oxide  800 mg Oral Q4H  . potassium chloride  40 mEq Oral Once  . QUEtiapine  25 mg Oral QHS  . sodium chloride  1 g Oral BID   Continuous Infusions: PRN Meds:.acetaminophen, benzonatate, hydrALAZINE, LORazepam, methocarbamol, metoprolol tartrate, ondansetron, oxyCODONE-acetaminophen, polyethylene glycol, simethicone Allergies  Allergen Reactions  . Nitrofurantoin Monohyd Macro Nausea Only   Review of Systems  Unable to perform ROS: Dementia    Physical Exam Vitals signs and nursing note reviewed.  Constitutional:      General: She is awake. She is not in acute distress.    Appearance: She is cachectic.    Cardiovascular:     Rate and Rhythm: Normal rate.  Pulmonary:     Effort: Pulmonary effort is normal. No tachypnea, accessory muscle usage or respiratory distress.  Neurological:     Mental Status: She is alert. Mental status is at baseline. She is confused.     Comments: Oriented to person     Vital Signs: BP (!) 170/82 (BP Location: Right Arm)   Pulse 91   Temp 98.1 F (36.7 C) (Oral)   Resp 20   Wt 40  kg   SpO2 96%   BMI 15.14 kg/m  Pain Scale: 0-10 POSS *See Group Information*: S-Acceptable,Sleep, easy to arouse Pain Score: 0-No pain   SpO2: SpO2: 96 % O2 Device:SpO2: 96 % O2 Flow Rate: .O2 Flow Rate (L/min): 2 L/min  IO: Intake/output summary:   Intake/Output Summary (Last 24 hours) at 04/29/2018 3838 Last data filed at 04/29/2018 0500 Gross per 24 hour  Intake 829.82 ml  Output 2650 ml  Net -1820.18 ml    LBM: Last BM Date: 04/25/18 Baseline Weight: Weight: 40 kg Most recent weight: Weight: 40 kg     Palliative Assessment/Data:   Flowsheet Rows     Most Recent Value  Intake Tab  Referral Department  Hospitalist  Unit at Time of Referral  Med/Surg Unit  Date Notified  04/28/18  Palliative Care Type  New Palliative care  Reason for referral  Clarify Goals of Care, Non-pain Symptom  Date of Admission  04/25/18  # of days IP prior to Palliative referral  3  Clinical Assessment  Psychosocial & Spiritual Assessment  Palliative Care Outcomes      Time In: 1030 Time Out: 1145 Time Total: 75 min Greater than 50%  of this time was spent counseling and coordinating care related to the above assessment and plan.  Signed by: Vinie Sill, NP Palliative Medicine Team Pager # [phone] (M-F 8a-5p) Team Phone # [phone] (Nights/Weekends)

## 2018-04-29 NOTE — NC FL2 (Signed)
[place] MEDICAID FL2 LEVEL OF CARE SCREENING TOOL     IDENTIFICATION  Patient Name: Julie [name] Birthdate: Aug 22, 1930 Sex: female Admission Date (Current Location): 04/25/2018  [facility] and [place] Number:  Herbalist and Address:  [facility] [address] [place], [place] [address]      Provider Number: [id number]  Attending Physician Name and Address:  Damita Lack, MD  Relative Name and Phone Number:       Current Level of Care: Hospital Recommended Level of Care: [place] Prior Approval Number:    Date Approved/Denied:   PASRR Number:    Discharge Plan: SNF    Current Diagnoses: Patient Active Problem List   Diagnosis Date Noted  . Fall 04/26/2018  . Fracture of femoral neck, right, closed ([place]) 04/25/2018  . Atrial fibrillation with RVR ([place]) 04/25/2018  . Closed left hip fracture, initial encounter ([place]) 03/22/2018  . Hip fracture ([place]) 03/22/2018  . Protein-calorie malnutrition, severe 01/23/2018  . Anxiety 01/22/2018  . Mass of breast, left 01/22/2018  . Hyponatremia 01/22/2018  . Cognitive decline 01/22/2018  . Opacity of lung on imaging study 01/22/2018  . Anorexia 01/22/2018  . Weight loss 01/22/2018  . Hypokalemia 01/22/2018  . HTN (hypertension) 07/19/2011    Orientation RESPIRATION BLADDER Height & Weight     Self, Place  Normal Incontinent Weight: 88 lb 2.9 oz (40 kg) Height:     BEHAVIORAL SYMPTOMS/MOOD NEUROLOGICAL BOWEL NUTRITION STATUS      Continent Diet(see DC summary)  AMBULATORY STATUS COMMUNICATION OF NEEDS Skin   Extensive Assist Verbally Surgical wounds(right hip, foam dressing)                       Personal Care Assistance Level of Assistance  Bathing, Feeding, Dressing Bathing Assistance: Limited assistance Feeding assistance: Limited assistance Dressing Assistance: Limited assistance     Functional Limitations Info  Sight, Hearing, Speech Sight Info:  Adequate Hearing Info: Adequate Speech Info: Adequate    SPECIAL CARE FACTORS FREQUENCY  PT (By licensed PT), OT (By licensed OT)     PT Frequency: 5x/wk OT Frequency: 5x/wk            Contractures Contractures Info: Not present    Additional Factors Info  Code Status, Allergies, Psychotropic Code Status Info: DNR Allergies Info: Nitrofurantoin Monohyd Macro Psychotropic Info: Depakote 125 daily at bed; Remeron 15 daily at bed; Seroquel 25 daily at bed         Current Medications (04/29/2018):  This is the current hospital active medication list Current Facility-Administered Medications  Medication Dose Route Frequency Provider Last Rate Last Dose  . acetaminophen (TYLENOL) tablet 650 mg  650 mg Oral Q6H PRN Erle Crocker, MD   650 mg at 04/26/18 0230  . artificial tears (LACRILUBE) ophthalmic ointment 1 application  1 application Both Eyes V5I Erle Crocker, MD   1 application at 43/32/95 0544  . benzonatate (TESSALON) capsule 100 mg  100 mg Oral Q8H PRN Erle Crocker, MD      . clindamycin (CLEOCIN) capsule 150 mg  150 mg Oral Q8H Amin, Ankit Chirag, MD   150 mg at 04/29/18 0544  . dextrose 5 %-0.45 % sodium chloride infusion   Intravenous Continuous Amin, Ankit Chirag, MD      . diltiazem (CARDIZEM) tablet 30 mg  30 mg Oral Q12H Erle Crocker, MD   30 mg at 04/29/18 1014  . divalproex (  DEPAKOTE) DR tablet 125 mg  125 mg Oral QHS Erle Crocker, MD   125 mg at 04/28/18 2302  . feeding supplement (ENSURE ENLIVE) (ENSURE ENLIVE) liquid 237 mL  237 mL Oral TID BM Amin, Ankit Chirag, MD   237 mL at 04/29/18 1249  . feeding supplement (PRO-STAT SUGAR FREE 64) liquid 30 mL  30 mL Oral BID Erle Crocker, MD   30 mL at 04/29/18 1014  . ferrous sulfate tablet 325 mg  325 mg Oral Q breakfast Erle Crocker, MD   325 mg at 04/29/18 1014  . hydrALAZINE (APRESOLINE) injection 5 mg  5 mg Intravenous Q2H PRN Erle Crocker, MD      .  lactobacillus acidophilus (BACID) tablet 2 tablet  2 tablet Oral TID Damita Lack, MD   2 tablet at 04/29/18 1014  . LORazepam (ATIVAN) tablet 0.25 mg  0.25 mg Oral Q8H PRN Amin, Ankit Chirag, MD   0.25 mg at 04/28/18 1527  . magnesium oxide (MAG-OX) tablet 800 mg  800 mg Oral Q4H Amin, Ankit Chirag, MD   800 mg at 04/29/18 1014  . methocarbamol (ROBAXIN) tablet 500 mg  500 mg Oral Q8H PRN Erle Crocker, MD   500 mg at 04/27/18 0153  . metoprolol tartrate (LOPRESSOR) injection 2.5 mg  2.5 mg Intravenous Q5 min PRN Erle Crocker, MD   2.5 mg at 04/28/18 8592  . mirtazapine (REMERON SOL-TAB) disintegrating tablet 15 mg  15 mg Oral QHS Pershing Proud, NP      . ondansetron [facility]) injection 4 mg  4 mg Intravenous Q8H PRN Erle Crocker, MD      . oxyCODONE-acetaminophen (PERCOCET/ROXICET) 5-325 MG per tablet 1 tablet  1 tablet Oral Q4H PRN Erle Crocker, MD   1 tablet at 04/27/18 [phone]  . polyethylene glycol (MIRALAX / GLYCOLAX) packet 17 g  17 g Oral Daily PRN Erle Crocker, MD      . QUEtiapine (SEROQUEL) tablet 25 mg  25 mg Oral QHS Amin, Ankit Chirag, MD   25 mg at 04/28/18 2303  . simethicone (MYLICON) 40 QK/8.6NO suspension 120 mg  120 mg Oral Q8H PRN Erle Crocker, MD   120 mg at 04/26/18 0209  . sodium chloride tablet 1 g  1 g Oral BID Erle Crocker, MD   1 g at 04/29/18 1014     Discharge Medications: Please see discharge summary for a list of discharge medications.  Relevant Imaging Results:  Relevant Lab Results:   Additional Information SS#: [id number]  Geralynn Ochs, LCSW

## 2018-04-30 LAB — CBC
HCT: 33.3 % — ABNORMAL LOW (ref 36.0–46.0)
HEMOGLOBIN: 11.4 g/dL — AB (ref 12.0–15.0)
MCH: 30.2 pg (ref 26.0–34.0)
MCHC: 34.2 g/dL (ref 30.0–36.0)
MCV: 88.3 fL (ref 80.0–100.0)
Platelets: 211 10*3/uL (ref 150–400)
RBC: 3.77 MIL/uL — ABNORMAL LOW (ref 3.87–5.11)
RDW: 20.9 % — ABNORMAL HIGH (ref 11.5–15.5)
WBC: 5.8 10*3/uL (ref 4.0–10.5)
nRBC: 0 % (ref 0.0–0.2)

## 2018-04-30 LAB — GLUCOSE, CAPILLARY
Glucose-Capillary: 104 mg/dL — ABNORMAL HIGH (ref 70–99)
Glucose-Capillary: 149 mg/dL — ABNORMAL HIGH (ref 70–99)

## 2018-04-30 LAB — BASIC METABOLIC PANEL
Anion gap: 11 (ref 5–15)
BUN: 8 mg/dL (ref 8–23)
CO2: 27 mmol/L (ref 22–32)
Calcium: 8.2 mg/dL — ABNORMAL LOW (ref 8.9–10.3)
Chloride: 93 mmol/L — ABNORMAL LOW (ref 98–111)
Creatinine, Ser: 0.41 mg/dL — ABNORMAL LOW (ref 0.44–1.00)
GFR calc Af Amer: >60 mL/min (ref >60)
GFR calc non Af Amer: >60 mL/min (ref >60)
GLUCOSE: 103 mg/dL — AB (ref 70–99)
Potassium: 2.9 mmol/L — ABNORMAL LOW (ref 3.5–5.1)
Sodium: 131 mmol/L — ABNORMAL LOW (ref 135–145)

## 2018-04-30 LAB — MAGNESIUM: Magnesium: 2 mg/dL (ref 1.7–2.4)

## 2018-04-30 MED ORDER — CLINDAMYCIN HCL 150 MG PO CAPS: 150.0000 mg | ORAL_CAPSULE | Freq: Three times a day (TID) | ORAL | Status: AC

## 2018-04-30 MED ORDER — MIRTAZAPINE 15 MG PO TABS: 15.0000 mg | ORAL_TABLET | Freq: Every day | ORAL | Status: DC

## 2018-04-30 MED ORDER — BACID PO TABS: 2.0000 | ORAL_TABLET | Freq: Three times a day (TID) | ORAL | Status: AC

## 2018-04-30 MED ORDER — DILTIAZEM HCL 30 MG PO TABS: 30.0000 mg | ORAL_TABLET | Freq: Two times a day (BID) | ORAL | Status: AC

## 2018-04-30 MED ORDER — CLONAZEPAM 0.125 MG PO TBDP: 0.1250 mg | ORAL_TABLET | Freq: Two times a day (BID) | ORAL | 0 refills | Status: AC | PRN

## 2018-04-30 MED ORDER — QUETIAPINE FUMARATE 25 MG PO TABS: 25.0000 mg | ORAL_TABLET | Freq: Every day | ORAL | Status: AC

## 2018-04-30 MED ORDER — POLYETHYLENE GLYCOL 3350 17 G PO PACK: 17.0000 g | PACK | Freq: Every day | ORAL | Status: AC | PRN

## 2018-04-30 MED ORDER — MIRTAZAPINE 15 MG PO TABS: 15.0000 mg | ORAL_TABLET | Freq: Every day | ORAL | 0 refills | Status: AC

## 2018-04-30 MED ORDER — OXYCODONE-ACETAMINOPHEN 5-325 MG PO TABS: 1.0000 | ORAL_TABLET | Freq: Four times a day (QID) | ORAL | 0 refills | Status: AC | PRN

## 2018-04-30 MED ORDER — METHOCARBAMOL 500 MG PO TABS: 500.0000 mg | ORAL_TABLET | Freq: Three times a day (TID) | ORAL | Status: AC | PRN

## 2018-04-30 MED ORDER — MIRTAZAPINE 15 MG PO TBDP: 15.0000 mg | ORAL_TABLET | Freq: Every day | ORAL | Status: DC

## 2018-04-30 MED ORDER — POTASSIUM CHLORIDE CRYS ER 20 MEQ PO TBCR
40.0000 meq | EXTENDED_RELEASE_TABLET | ORAL | Status: AC
  Administered 2018-04-30 (×2): 40 meq via ORAL
  Filled 2018-04-30 (×2): qty 2

## 2018-04-30 MED ORDER — MIRTAZAPINE 15 MG PO TABS
15.0000 mg | ORAL_TABLET | Freq: Every day | ORAL | Status: AC
  Administered 2018-04-30: 15 mg via ORAL
  Filled 2018-04-30: qty 1

## 2018-04-30 NOTE — Progress Notes (Signed)
Chaplain responded to spiritual care consult.  Pt was alert and talkative.  Chaplain provided ministry of presence.  Patient did not acknowledge her end of life but spoke of her mild confusion and her desire to see her daughter and grandchildren in [place]. She was anxious about making the trip but determined.  She told me the story of her stuffed panda and some of her life in [place], and her education. Chaplain understands patient is returning to SNF where she has been living. The palliative care, Ms. Grandville Silos, chaplain was not available today.  Feel free to reach out if more follow up is needed. Rev. Tamsen Snider Pager [phone]

## 2018-04-30 NOTE — Discharge Summary (Addendum)
Physician Discharge Summary   Patient ID: Julie [name] MRN: [id number] DOB/AGE: 1930/09/06 84 y.o.  Admit date: 04/25/2018 Discharge date: 04/30/2018  Primary Care Physician:  Hayden Rasmussen, MD   Recommendations for Outpatient Follow-up:  1. Follow up with PCP in 1-2 weeks 2. Please obtain BMP/CBC in one week  3. Palliative care to follow at skilled [place]: Patient being discharged to skilled nursing facility Equipment/Devices:   Discharge Condition: stable  CODE STATUS: DNR Diet recommendation: [place] food   Discharge Diagnoses:   . Fracture of femoral neck, right, closed ([place]) Acute blood loss anemia, postop Acute on chronic neck pain . Hypokalemia . HTN (hypertension) . Right-sided maxillary and mandibular pain/dental abscess . Atrial fibrillation with RVR ([place])  . Cognitive decline . Protein-calorie malnutrition, severe . Fall   Consults: Orthopedics Palliative care    Allergies:   Allergies  Allergen Reactions  . Nitrofurantoin Monohyd Macro Nausea Only     DISCHARGE MEDICATIONS: Allergies as of 04/30/2018      Reactions   Nitrofurantoin Monohyd Macro Nausea Only      Medication List    STOP taking these medications   LORazepam 0.5 MG tablet Commonly known as:  ATIVAN   senna-docusate 8.6-50 MG tablet Commonly known as:  Senokot-S   traMADol 50 MG tablet Commonly known as:  ULTRAM     TAKE these medications   acetaminophen 325 MG tablet Commonly known as:  TYLENOL Take 2 tablets (650 mg total) by mouth every 6 (six) hours as needed for mild pain, fever or headache.   artificial tears Oint ophthalmic ointment Commonly known as:  LACRILUBE Place into both eyes every 8 (eight) hours. What changed:  how much to take   aspirin 325 MG EC tablet Take 1 tablet (325 mg total) by mouth daily. What changed:  when to take this   benzonatate 100 MG capsule Commonly known as:  TESSALON Take 100 mg by mouth  every 8 (eight) hours as needed for cough.   clindamycin 150 MG capsule Commonly known as:  CLEOCIN Take 1 capsule (150 mg total) by mouth 3 (three) times daily for 5 days.   clonazepam 0.125 MG disintegrating tablet Commonly known as:  KLONOPIN Take 1 tablet (0.125 mg total) by mouth 2 (two) times daily as needed (agitation, anxiety).   diltiazem 30 MG tablet Commonly known as:  CARDIZEM Take 1 tablet (30 mg total) by mouth every 12 (twelve) hours.   divalproex 125 MG DR tablet Commonly known as:  DEPAKOTE Take 125 mg by mouth at bedtime.   feeding supplement (PRO-STAT SUGAR FREE 64) Liqd Take 30 mLs by mouth 2 (two) times daily.   ferrous sulfate 325 (65 FE) MG tablet Take 1 tablet (325 mg total) by mouth daily with breakfast.   lactobacillus acidophilus Tabs tablet Take 2 tablets by mouth 3 (three) times daily.   methocarbamol 500 MG tablet Commonly known as:  ROBAXIN Take 1 tablet (500 mg total) by mouth every 8 (eight) hours as needed for muscle spasms.   mirtazapine 15 MG tablet Commonly known as:  REMERON Take 1 tablet (15 mg total) by mouth at bedtime. Start taking on:  May 01, 2018   oxyCODONE-acetaminophen 5-325 MG tablet Commonly known as:  PERCOCET/ROXICET Take 1 tablet by mouth every 6 (six) hours as needed for moderate pain.   polyethylene glycol packet Commonly known as:  MIRALAX / GLYCOLAX Take 17 g by mouth daily as needed for mild constipation or moderate  constipation.   potassium chloride 10 MEQ tablet Commonly known as:  K-DUR Take 10 mEq by mouth 2 (two) times daily.   QUEtiapine 25 MG tablet Commonly known as:  SEROQUEL Take 1 tablet (25 mg total) by mouth at bedtime.   simethicone 40 mg/0.54ml Susp Commonly known as:  MYLICON Take 614 mg by mouth every 8 (eight) hours as needed for flatulence.   sodium chloride 1 g tablet Take 1 g by mouth 2 (two) times daily.        Brief H and P: For complete details please refer to  admission H and P, but in brief *84 year old with history of essential hypertension, breast cancer, recent cognitive decline, anxiety comes from the rehab for evaluation of mechanical fall and right-sided hip pain.  Patient was recently discharged from here about a month ago after having a left hip fracture requiring intramedullary rod fixation November 2019.  And now presenting with right displaced comminuted inter trochanteric Fracture requiring surgical repair.  Patient underwent procedure on 12/14 and tolerated it well.  Had about 200 cc of blood loss during the procedure.  Required 2 units of PRBC transfusion on 12/16.[facility] Course:   Right femoral neck fracture, closed Orthopedics was consulted.  Patient underwent surgical repair on 12/15 Continue pain control, narcotics only for severe pain, Continue aspirin for DVT prophylaxis, follow H&H closely Hemoglobin on 12/16 was 6.5, patient was transfused 2 units packed RBCs on 12/16.  Hemoglobin at the time of discharge 11.4.  Acute anemia, likely acute blood loss anemia postop Hemoglobin was 6.5 on 12/16 after surgery.  No obvious signs of bleeding. Patient received 2 units packed RBCs, hemoglobin at the time of discharge 11.4, stable for 48 hours.   Acute on chronic neck pain with leaning towards one side Subacute T2 body and right medial clavicle fracture Cervical neck arthropathy/degenerative disease with foraminal nerve impingement without upper extremity weakness or neurologic signs - Unable to get MRI of the brain due to claustrophobia -CT of the cervical spine does show subacute T2 and right medial clavicle fractures.  Several areas of advanced degenerative disease and possibly nerve impingement.  No complains of focal weakness, numbness and tingling in her upper extremities. -Currently not complaining of any pain, outpatient follow-up with orthopedics recommended   Right-sided maxillary and mandibular pain Dental  abscess -Poor dentition.  CT of the maxillofacial region suggestive of possible abscess.  Patient was started on clindamycin, last day on 12/22.   -Recommend outpatient follow-up with dentistry or OMFS next week.  Dementia with cognitive decline, anxiety with intermittent agitation. -Patient was placed on Seroquel, Depakote and low-dose Klonopin. Patient has previously developed hyponatremia with mirtazapine.  Patient's daughter still requested to continue Remeron with a risk of worsening hyponatremia.  Follow bmet. Palliative care was also consulted  Severe protein calorie malnutrition Nutrition team was consulted, recommended nutritional supplements  Atrial fibrillation Currently rate controlled, continue aspirin, H&H has remained stable  Hypokalemia, hypomagnesemia Replaced, continue oral potassium replacement outpatient    Day of Discharge S: No new complaints, pain controlled  BP 116/65 (BP Location: Right Arm)   Pulse 79   Temp 97.9 F (36.6 C) (Oral)   Resp 19   Wt 40 kg   SpO2 95%   BMI 15.14 kg/m   Physical Exam: General: Alert and awake oriented x to self and place, not in any acute distress. HEENT: anicteric sclera, pupils reactive to light and accommodation CVS: S1-S2 clear no murmur rubs or gallops  Chest: clear to auscultation bilaterally, no wheezing rales or rhonchi Abdomen: soft nontender, nondistended, normal bowel sounds Extremities: no cyanosis, clubbing or edema noted bilaterally Neuro: No new deficits   The results of significant diagnostics from this hospitalization (including imaging, microbiology, ancillary and laboratory) are listed below for reference.      Procedures/Studies:  Ct Head Wo Contrast  Result Date: 04/25/2018 CLINICAL DATA:  Fall with dementia EXAM: CT HEAD WITHOUT CONTRAST TECHNIQUE: Contiguous axial images were obtained from the base of the skull through the vertex without intravenous contrast. COMPARISON:  CT brain and  cervical spine 02/02/2018 FINDINGS: Brain: No acute territorial infarction, hemorrhage or intracranial mass allowing for motion and patient positioning. Moderate-to-marked atrophy. Moderate severe small vessel ischemic changes of the white matter. Stable ventricle size. Vascular: No hyperdense vessel.  Carotid vascular calcification. Skull: Normal. Negative for fracture or focal lesion. Sinuses/Orbits: No acute finding. Other: None IMPRESSION: 1. Limited study secondary to motion degradation and patient positioning. 2. No definite CT evidence for acute intracranial abnormality. 3. Atrophy and small vessel ischemic changes of the white matter. Electronically Signed   By: Donavan Foil M.D.   On: 04/25/2018 19:16   Ct Cervical Spine Wo Contrast  Result Date: 04/29/2018 CLINICAL DATA:  Chronic neck pain EXAM: CT CERVICAL SPINE WITHOUT CONTRAST TECHNIQUE: Multidetector CT imaging of the cervical spine was performed without intravenous contrast. Multiplanar CT image reconstructions were also generated. COMPARISON:  02/02/2018 FINDINGS: Alignment: Dextrocurvature. Skull base and vertebrae: T2 body fracture with horizontal sclerotic flexure plane following the inferior endplate which is mildly depressed. No significant retropulsion. The fracture has a subacute appearance. Medial right clavicular head fracture with impaction. There is callus formation compatible with nonacute timing. Soft tissues and spinal canal: No gross canal collection or soft tissue inflammation. Disc levels: C1-2: Advanced left facet spurring. Disc height loss may impinge on the C2 nerve root. C2-3: Left facet ankylosis.  No impingement C3-4: Advanced disc narrowing and uncovertebral ridging. Asymmetric left facet spurring. There is left more than right foraminal impingement C4-5: Advanced disc narrowing with uncovertebral ridging. Asymmetric left facet spurring. Moderate bilateral foraminal narrowing. C5-6: Advanced disc narrowing with  asymmetric rightward endplate and uncovertebral ridging. Advanced right foraminal impingement. Patent canal C6-7: Advanced disc narrowing with mild ridging. Mild to moderate foraminal narrowing greater on the right. C7-T1:Facet degeneration and disc narrowing with slight anterolisthesis. No evident impingement Upper chest: Extensive atherosclerotic calcification. IMPRESSION: 1. Subacute appearing T2 body and right medial clavicle fractures. 2. Advanced disc and facet degeneration with foraminal impingement most notable at C3-4 bilaterally, C4-5 bilaterally, and C5-6 on the right. 3. Advanced left-sided facet spurring at C1-2 with probable C2 impingement. Electronically Signed   By: Monte Fantasia M.D.   On: 04/29/2018 09:44   Ct Hip Right Wo Contrast  Result Date: 04/26/2018 CLINICAL DATA:  Right hip fracture EXAM: CT OF THE RIGHT HIP WITHOUT CONTRAST TECHNIQUE: Multidetector CT imaging of the right hip was performed according to the standard protocol. Multiplanar CT image reconstructions were also generated. COMPARISON:  Radiographs from 04/25/2018 FINDINGS: Bones/Joint/Cartilage Mildly comminuted intertrochanteric hip fracture, with fracture planes involving the greater trochanter and upper margin of the lesser trochanter indicating involvement outside of the joint. There is also a femoral neck component laterally for example as shown on image 34/6. Varus angulation is observed. Chronic healed deformity of the left pubic rami from old fracture. Ligaments Suboptimally assessed by CT. Muscles and Tendons Expected edema along tissue planes in the vicinity of the fracture  site. Soft tissues Distended urinary bladder with cystocele. Iliac atherosclerotic calcification. IMPRESSION: 1. Mildly comminuted intertrochanteric hip fracture with fracture planes involving the greater trochanter and upper margin of the lesser trochanter indicating involvement outside of the joint. However, there is also some extension into  the lateral portion of the femoral neck indicating a component of intra-articular involvement. Varus angulation. 2. Distended urinary bladder with cystocele. 3. Atherosclerosis. 4. Old healed fractures of the left pubic rami with resulting deformities. Electronically Signed   By: Van Clines M.D.   On: 04/26/2018 00:45   Dg C-arm 1-60 Min  Result Date: 04/26/2018 CLINICAL DATA:  IM nailing of intertrochanteric femur fracture EXAM: DG C-ARM 61-120 MIN; OPERATIVE RIGHT HIP WITH PELVIS FLUOROSCOPY TIME:  1 minute, 18 seconds COMPARISON:  Right hip radiographs-04/25/2018 FINDINGS: 4 spot intraoperative fluoroscopic images of the right femur are provided for review. Images demonstrate the sequela of intramedullary rod fixation of the right femur and dynamic screw fixation of the right femoral neck. The distal end of the intramedullary femoral rod is transfixed with a single cancellous screw. Alignment appears near anatomic. Residual deformity about the right lesser trochanter. No definite new fracture. Adjacent vascular calcifications. Expected adjacent subcutaneous emphysema. No radiopaque foreign body. IMPRESSION: Post intramedullary rod fixation of comminuted intertrochanteric femur fracture without evidence of complication. Electronically Signed   By: Sandi Mariscal M.D.   On: 04/26/2018 10:56   Dg Hip Operative Unilat W Or W/o Pelvis Right  Result Date: 04/26/2018 CLINICAL DATA:  IM nailing of intertrochanteric femur fracture EXAM: DG C-ARM 61-120 MIN; OPERATIVE RIGHT HIP WITH PELVIS FLUOROSCOPY TIME:  1 minute, 18 seconds COMPARISON:  Right hip radiographs-04/25/2018 FINDINGS: 4 spot intraoperative fluoroscopic images of the right femur are provided for review. Images demonstrate the sequela of intramedullary rod fixation of the right femur and dynamic screw fixation of the right femoral neck. The distal end of the intramedullary femoral rod is transfixed with a single cancellous screw. Alignment  appears near anatomic. Residual deformity about the right lesser trochanter. No definite new fracture. Adjacent vascular calcifications. Expected adjacent subcutaneous emphysema. No radiopaque foreign body. IMPRESSION: Post intramedullary rod fixation of comminuted intertrochanteric femur fracture without evidence of complication. Electronically Signed   By: Sandi Mariscal M.D.   On: 04/26/2018 10:56   Dg Hip Unilat W Or Wo Pelvis 2-3 Views Right  Result Date: 04/25/2018 CLINICAL DATA:  Hip pain EXAM: DG HIP (WITH OR WITHOUT PELVIS) 2-3V RIGHT COMPARISON:  None. FINDINGS: Fixating rod within the left femur. Old left superior and inferior pubic rami fractures. Acute fracture involving the base of the right femoral neck. No dislocation. IMPRESSION: 1. Acute displaced fracture involving the base of the right femoral neck. 2. Old left superior and inferior pubic rami fractures. Electronically Signed   By: Donavan Foil M.D.   On: 04/25/2018 18:50   Ct Maxillofacial Wo Contrast  Result Date: 04/28/2018 CLINICAL DATA:  Headache, dental and mastoid pain after fall at skilled nursing facility. EXAM: CT MAXILLOFACIAL WITHOUT CONTRAST TECHNIQUE: Multidetector CT imaging of the maxillofacial structures was performed. Multiplanar CT image reconstructions were also generated. COMPARISON:  None. FINDINGS: Moderately motion degraded examination. OSSEOUS: No acute facial fracture. The mandible is intact, the condyles are located. No destructive bony lesions. Severe LEFT upper cervical facet arthropathy. Tooth 5 periapical lucency. ORBITS: Ocular globes and orbital contents are normal. SINUSES: Paranasal sinuses are well aerated. Intact nasal septum is midline. Mastoid aircells are well aerated. SOFT TISSUES: No significant soft tissue swelling. No  subcutaneous gas or radiopaque foreign bodies. Severe calcific atherosclerosis carotid bifurcations. Punctate LEFT parotid sialolith. LIMITED INTRACRANIAL: Nonacute. IMPRESSION: 1.  Moderately motion degraded examination. 2. Tooth 5 periapical lucency seen with abscess or avulsion injury. Electronically Signed   By: Elon Alas M.D.   On: 04/28/2018 04:56      LAB RESULTS: Basic Metabolic Panel: Recent Labs  Lab 04/29/18 0208 04/30/18 0442  NA 134* 131*  K 3.2* 2.9*  CL 95* 93*  CO2 26 27  GLUCOSE 103* 103*  BUN 13 8  CREATININE 0.55 0.41*  CALCIUM 8.2* 8.2*  MG 1.6* 2.0   Liver Function Tests: Recent Labs  Lab 04/28/18 0318  AST 24  ALT 15  ALKPHOS 107  BILITOT 0.7  PROT 4.9*  ALBUMIN 2.5*   No results for input(s): LIPASE, AMYLASE in the last 168 hours. No results for input(s): AMMONIA in the last 168 hours. CBC: Recent Labs  Lab 04/25/18 1740  04/29/18 0208 04/30/18 0442  WBC 11.4*   < > 7.2 5.8  NEUTROABS 10.2*  --   --   --   HGB 12.9   < > 10.7* 11.4*  HCT 38.6   < > 31.7* 33.3*  MCV 99.5   < > 85.7 88.3  PLT 241   < > 165 211   < > = values in this interval not displayed.   Cardiac Enzymes: No results for input(s): CKTOTAL, CKMB, CKMBINDEX, TROPONINI in the last 168 hours. BNP: Invalid input(s): POCBNP CBG: Recent Labs  Lab 04/30/18 0555 04/30/18 1133  GLUCAP 104* 149*      Disposition and Follow-up: Discharge Instructions    Diet - low sodium heart healthy   Complete by:  As directed    Increase activity slowly   Complete by:  As directed        DISPOSITION: [place] information for follow-up providers    Schedule an appointment as soon as possible for a visit with Erle Crocker, MD.   Specialty:  Orthopedic Surgery Contact information: [place] [address] [phone]        Hayden Rasmussen, MD. Schedule an appointment as soon as possible for a visit in 2 week(s).   Specialty:  Family Medicine Contact information: [place] [address] [phone]            Contact information for  after-discharge care    Destination    [facility] Preferred SNF .   Service:  Skilled Nursing Contact information: [place] [phone]                   Time coordinating discharge:  35 minutes  Signed:   Estill Cotta M.D. [facility] 04/30/2018, [place] PM Pager: [phone]

## 2018-04-30 NOTE — Progress Notes (Signed)
Subjective: 4 Days Post-Op Procedure(s) (LRB): INTRAMEDULLARY (IM) NAIL INTERTROCHANTRIC (Right) Patient is resting well.   Patient required 2 units of blood transfusion for postoperative anemia.  She had a good response with current hemoglobin at 11.4.   Objective: Vital signs in last 24 hours: Temp:  [97.8 F (36.6 C)-99.1 F (37.3 C)] 97.9 F (36.6 C) (12/18 1120) Pulse Rate:  [62-80] 79 (12/18 1120) Resp:  [17-19] 19 (12/18 1120) BP: (116-171)/(63-91) 116/65 (12/18 1120) SpO2:  [94 %-100 %] 95 % (12/18 1120)  Intake/Output from previous day: No intake/output data recorded. Intake/Output this shift: No intake/output data recorded.  Recent Labs    04/28/18 0318 04/28/18 0755 04/29/18 0208 04/30/18 0442  HGB 6.6* 6.5* 10.7* 11.4*   Recent Labs    04/29/18 0208 04/30/18 0442  WBC 7.2 5.8  RBC 3.70* 3.77*  HCT 31.7* 33.3*  PLT 165 211    Resting comfortably Right lower extremity in normal resting position.  Dressing intact.   Assessment/Plan: 4 Days Post-Op Procedure(s) (LRB): INTRAMEDULLARY (IM) NAIL INTERTROCHANTRIC (Right)  Patient appears to be doing well today.  Likely discharge soon.  Good hemoglobin response to transfusion.  From orthopedic standpoint: She is weightbearing as tolerated Keep dressing in place until follow-up She will follow-up in 2 weeks with me for x-rays and dressing removal Aspirin 325 mg DVT prophylaxis if appropriate per medical team Call the office with any concerns    Erle Crocker 04/30/2018, 2:26 PM

## 2018-04-30 NOTE — Progress Notes (Signed)
Patient is set to discharge to [facility] today with hospice following. Patient & both daughters, Ginger and Renee, aware. CSW discussed with daughter Ginger that hospice at a SNF facility would be an out-of pocket expense- was informed that they had "worked it out with [facility]". Discharge packet given to RN, Gaspar Bidding. [facility] called for transport.   Kingsley Spittle, LCSW Clinical Social Worker [phone]

## 2018-04-30 NOTE — Plan of Care (Signed)
Adequate for discharge.

## 2018-04-30 NOTE — Progress Notes (Addendum)
Palliative:  I met today with [name] along with personal caregiver who is with her on Wednesdays and brings her comfort and joy to see at bedside, Julie [name]. [name] is in good spirits with no complaints today. She is eating some of her lunch but only a little of a salad.   I was able to elicit a little of information regarding EOL from [name]. She talks of "well everyone is a little scared of death." Difficult to tell her specific fears as her cognitive decline prevents getting deep into conversation and she at times forgets my question and begins telling other stories. She does however talk of dying and talks of about going to heaven but also tells me that she doesn't want to rush going to heaven. Julie [name] says that they have been able to talk of this previously as well.   I called and spoke with daughter, Julie [name]. She left MOST form and discussed with her 2 sisters so I signed and placed on chart and provided Julie [name] a copy. MOST: DNR, limited interventions, antibiotics if needed, trial of IVF, and NO feeding tube. Julie [name] confirms desire for hospice at SNF and NOT to pursue rehab. She assures me that she and her sisters have communicated with SNF about her mother returning with hospice. I did bring up our concern about payor source and how Medicare will pay for hospice but typically Medicare only pays for rehab at SNF and you cannot have rehab and hospice at the same time. She confirms plan to return to SNF with hospice and NOT rehab.   Discussed medication (specifically mirtazapine) with Dr. Tana Coast. Discussed with RN plan for mirtazapine/Klonopin for anxiety and request for copy of medications to be given to daughter. Also discussed with CSW to send medications to SNF ahead of time to make sure she has her medications upon discharge.   All questions/concerns addressed. Emotional support provided.   Plan: - D/C to SNF with hospice  - MOST form complete and on chart - Anxiety/agitation: Mirtazapine  15 mg qhs. Klonopin 0.125 mg BID prn. Seroquel has been added previously at 25 mg qhs. May consider increasing Seroquel to BID OR d/c Seroquel and trying Zyprexa 2.5 mg qhs.  - Allow patient to stay in bed and watch her birds. Do not encourage to get up in wheelchair as she is expected to fall again (this is family request/desire) even though she has just had surgical repair of hip fracture this admission.   42 min  Vinie Sill, NP Palliative Medicine Team Pager # [phone] (M-F 8a-5p) Team Phone # [phone] (Nights/Weekends)

## 2019-01-18 IMAGING — CT CT HEAD W/O CM
2 of 5 series · 12 of 47 positions shown, 15 images · non-contrast
Comparison: CT brain and cervical spine 02/02/2018

CLINICAL DATA: Fall with dementia

EXAM:
CT HEAD WITHOUT CONTRAST
TECHNIQUE: Contiguous axial images were obtained from the base of the skull
through the vertex without intravenous contrast.

[Series 2: head wo · axial · 0.48mm/px · z∈[-119,+11]mm · 9 of 34 slices shown, 12 images]
[im 4/34  brain]
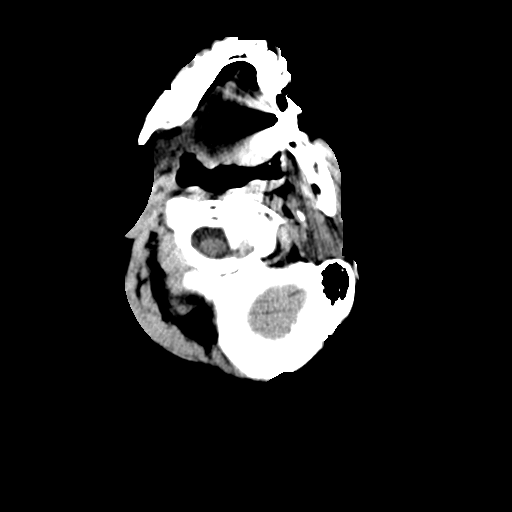
[im 4/34  bone]
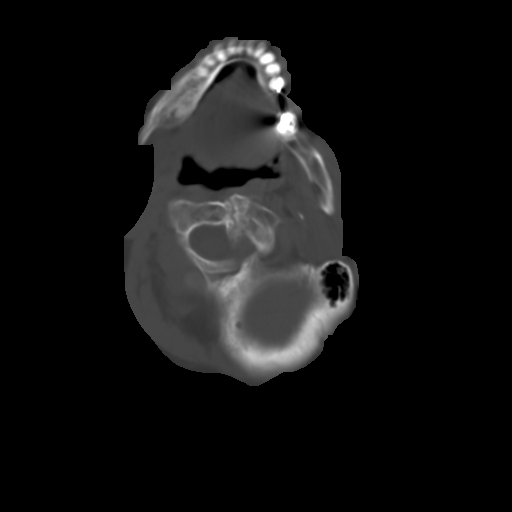
[im 7/34  brain]
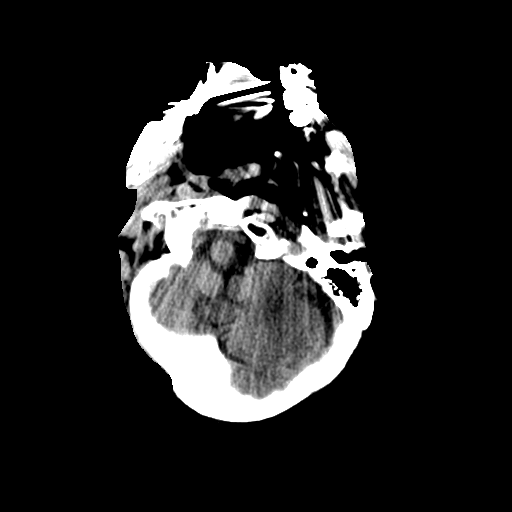
[im 10/34  brain]
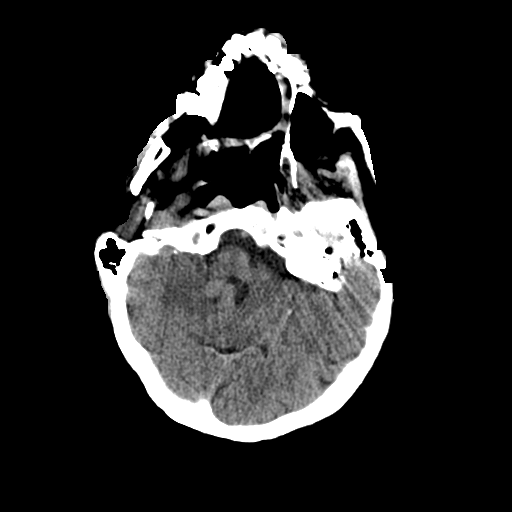
[im 13/34  brain]
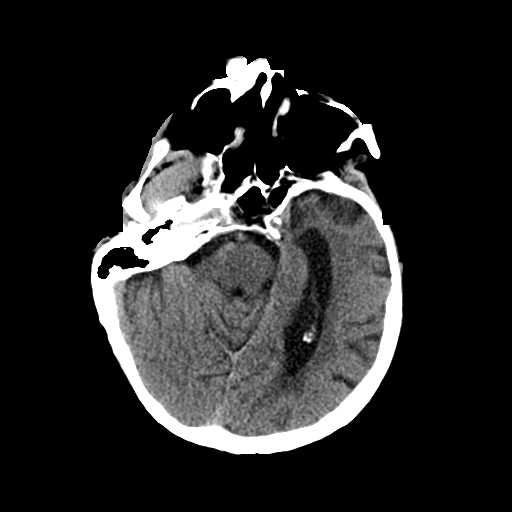
[im 18/34  brain]
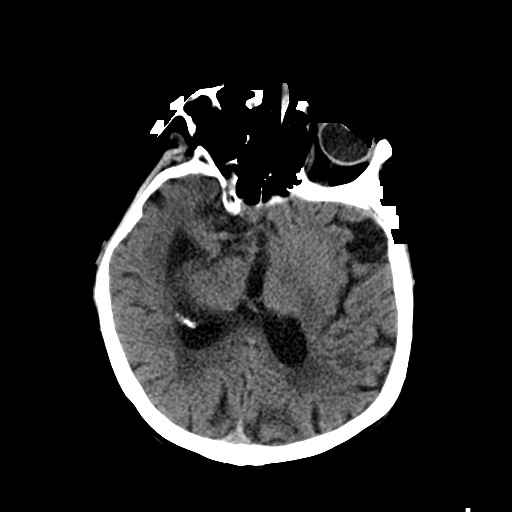
[im 18/34  bone]
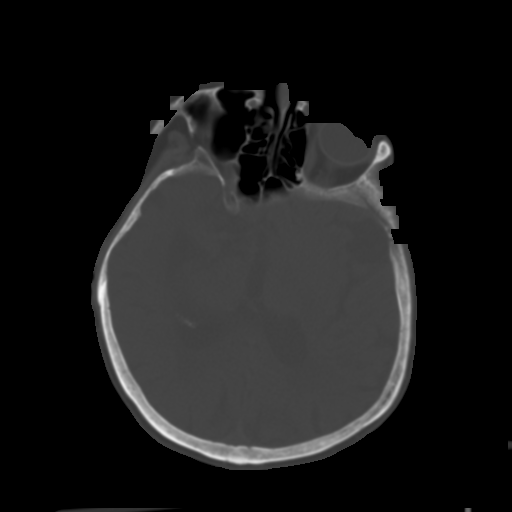
[im 21/34  brain]
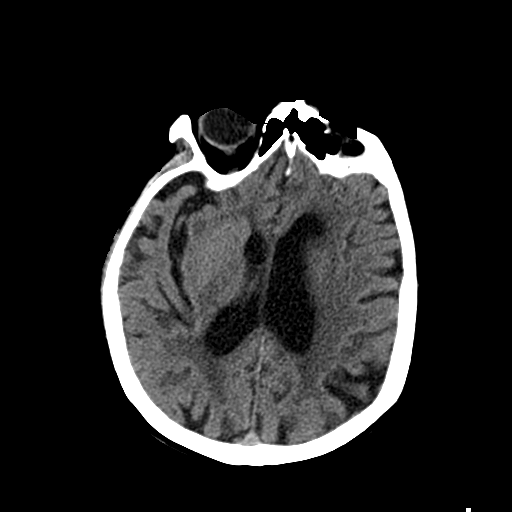
[im 24/34  brain]
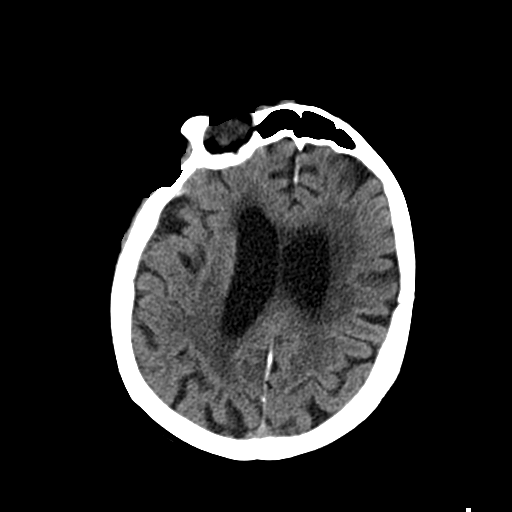
[im 27/34  brain]
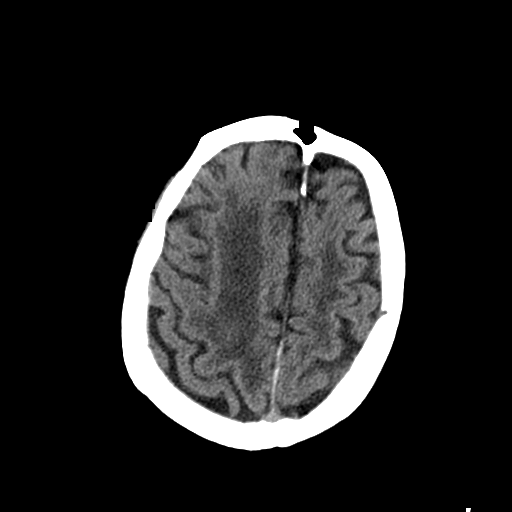
[im 30/34  brain]
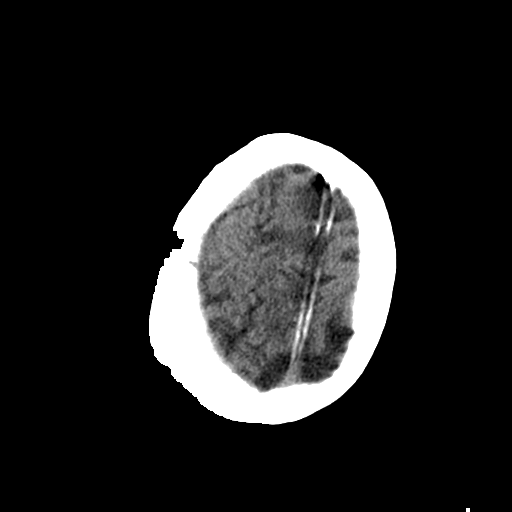
[im 30/34  bone]
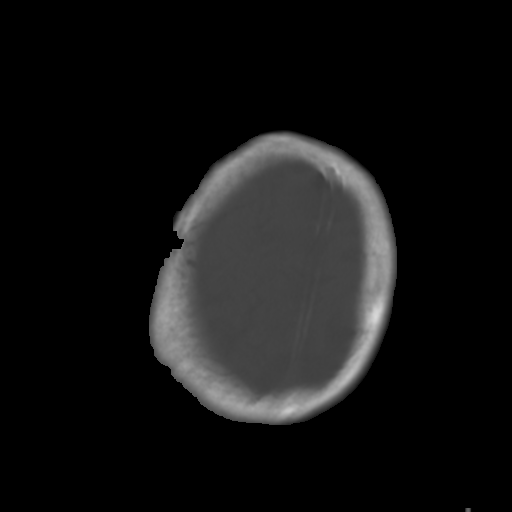

[Series 9: coronal soft tissue · coronal · 0.13mm/px · 3 of 59 slices shown]
[im 20/59  brain]
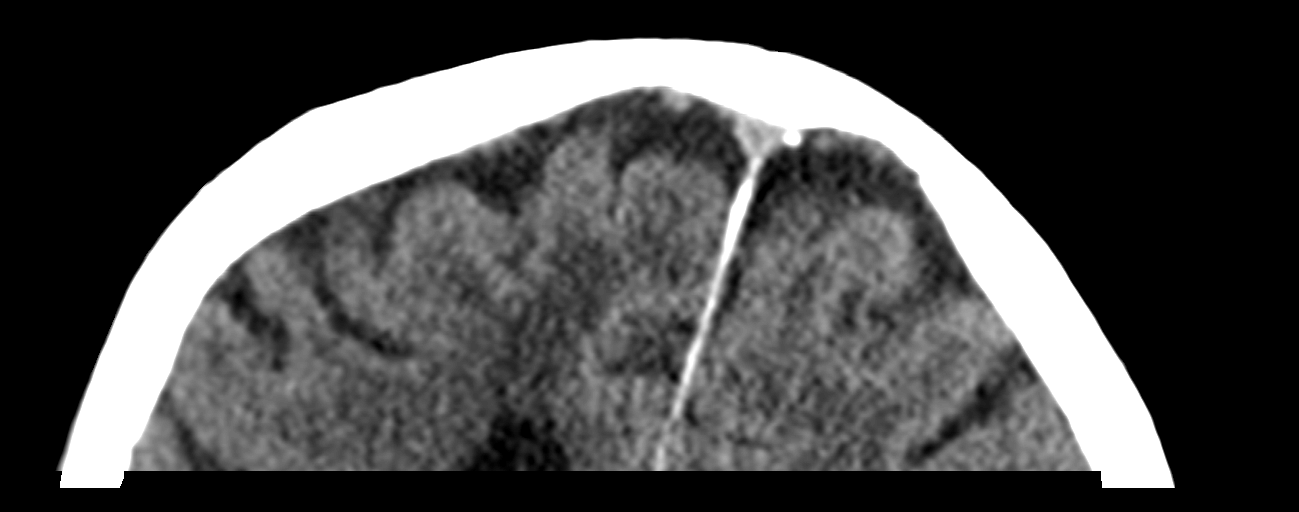
[im 26/59  brain]
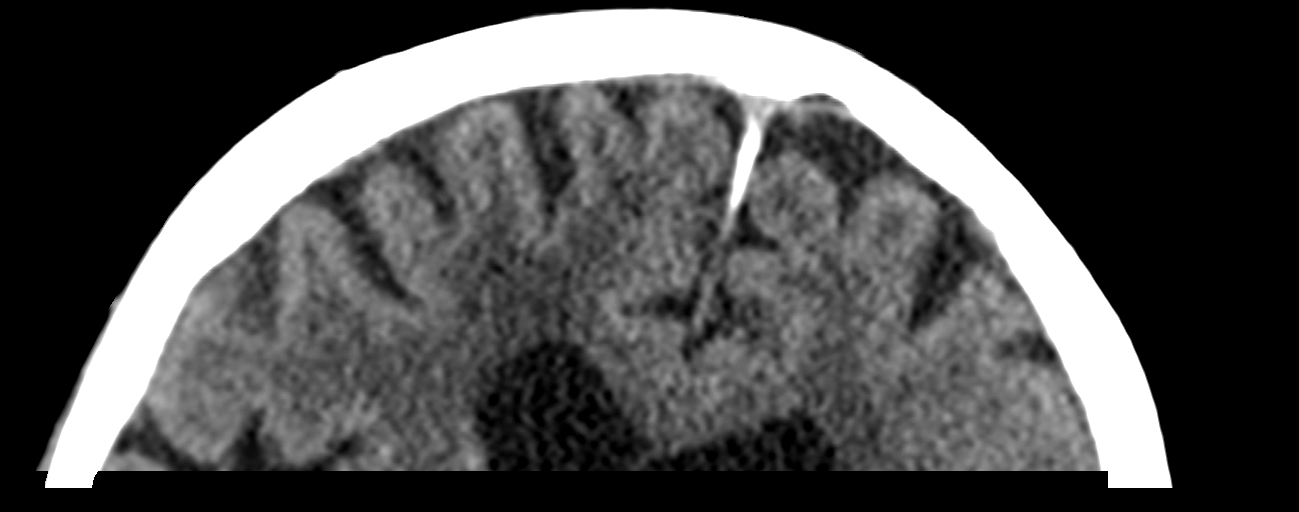
[im 33/59  brain]
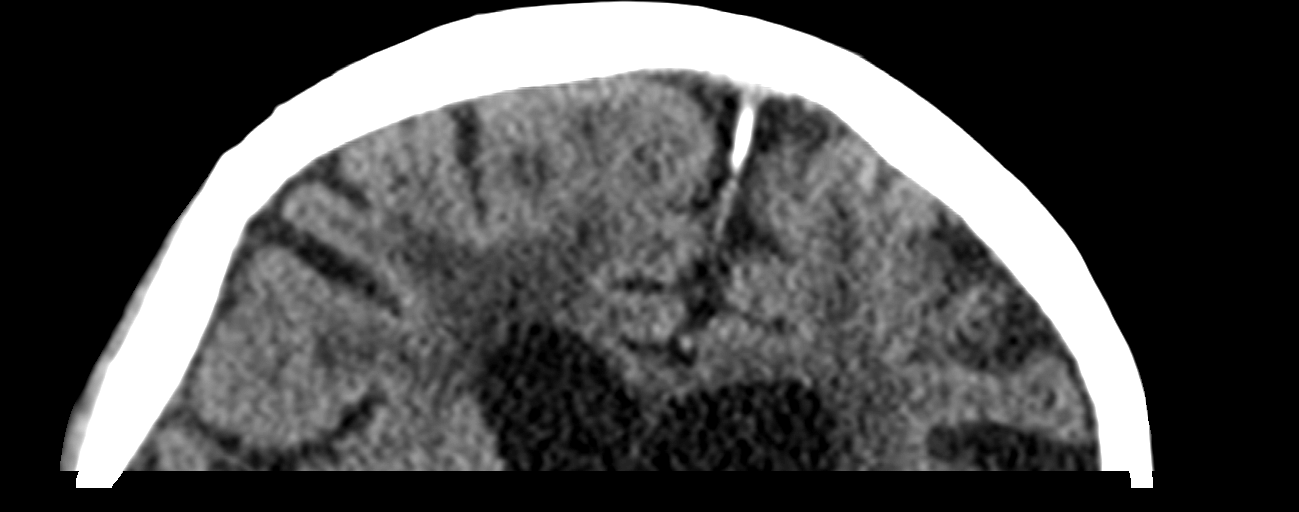

[12 of 47 positions shown; findings below may reference images not displayed]

FINDINGS: Brain: No acute territorial infarction, hemorrhage or intracranial
mass allowing for motion and patient positioning. Moderate-to-marked
atrophy. Moderate severe small vessel ischemic changes of the white
matter. Stable ventricle size.

Vascular: No hyperdense vessel.  Carotid vascular calcification.

Skull: Normal. Negative for fracture or focal lesion.

Sinuses/Orbits: No acute finding.

Other: None
IMPRESSION: 1. Limited study secondary to motion degradation and patient
positioning.
2. No definite CT evidence for acute intracranial abnormality.
3. Atrophy and small vessel ischemic changes of the white matter.

## 2019-01-19 IMAGING — RF DG HIP (WITH PELVIS) OPERATIVE*R*
1 series · 4 of 4 positions shown · non-contrast
Comparison: Right hip radiographs-04/25/2018

CLINICAL DATA: IM nailing of intertrochanteric femur fracture

EXAM:
DG C-ARM 61-120 MIN; OPERATIVE RIGHT HIP WITH PELVIS
FLUOROSCOPY TIME:  1 minute, 18 seconds

[Series 1: run · 4 of 4 slices shown]
[im 1/4]
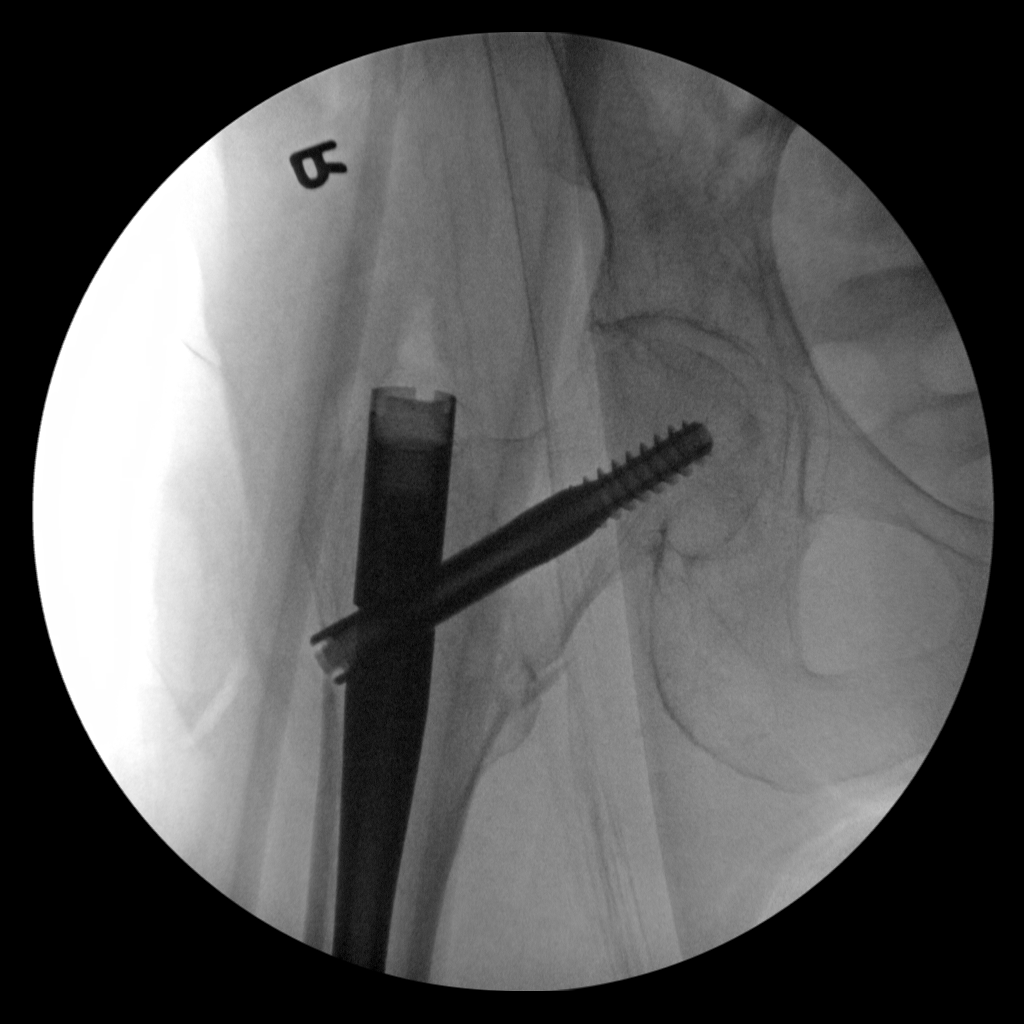
[im 2/4]
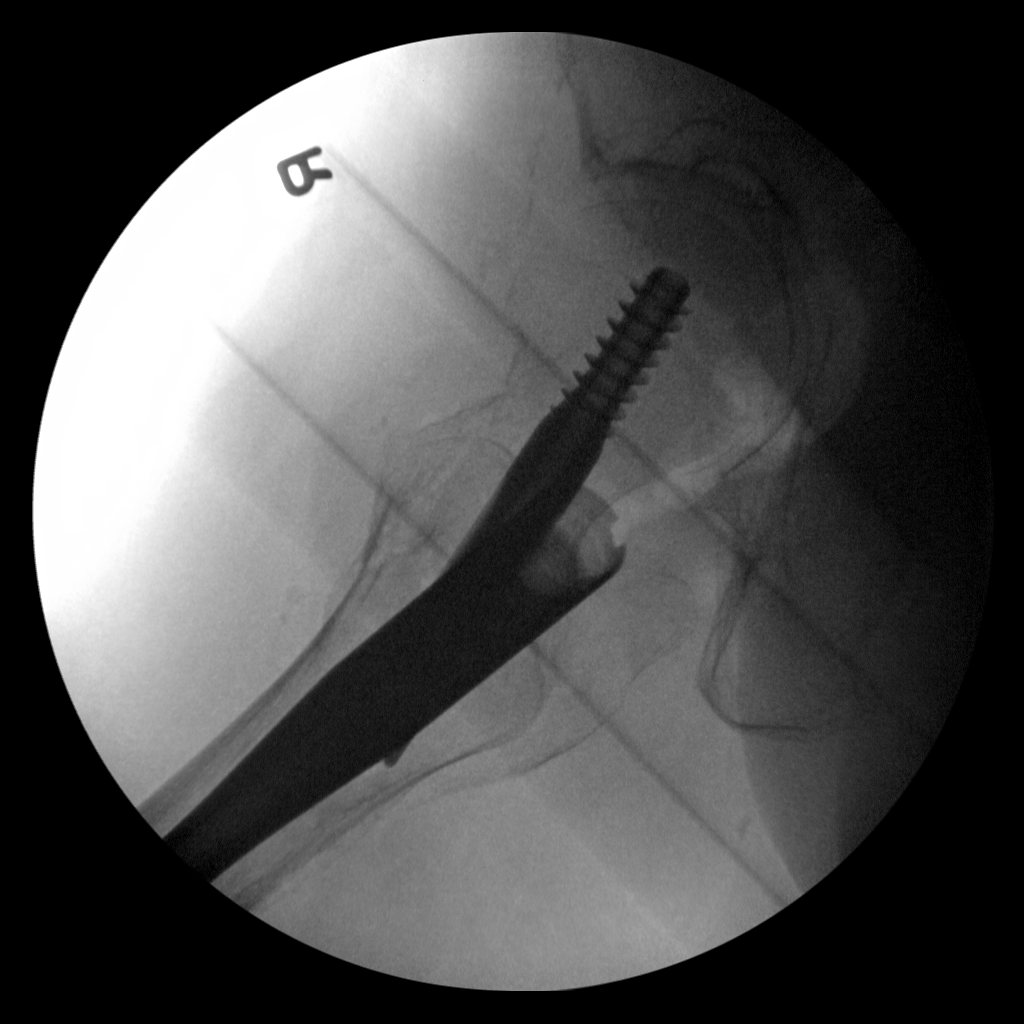
[im 3/4]
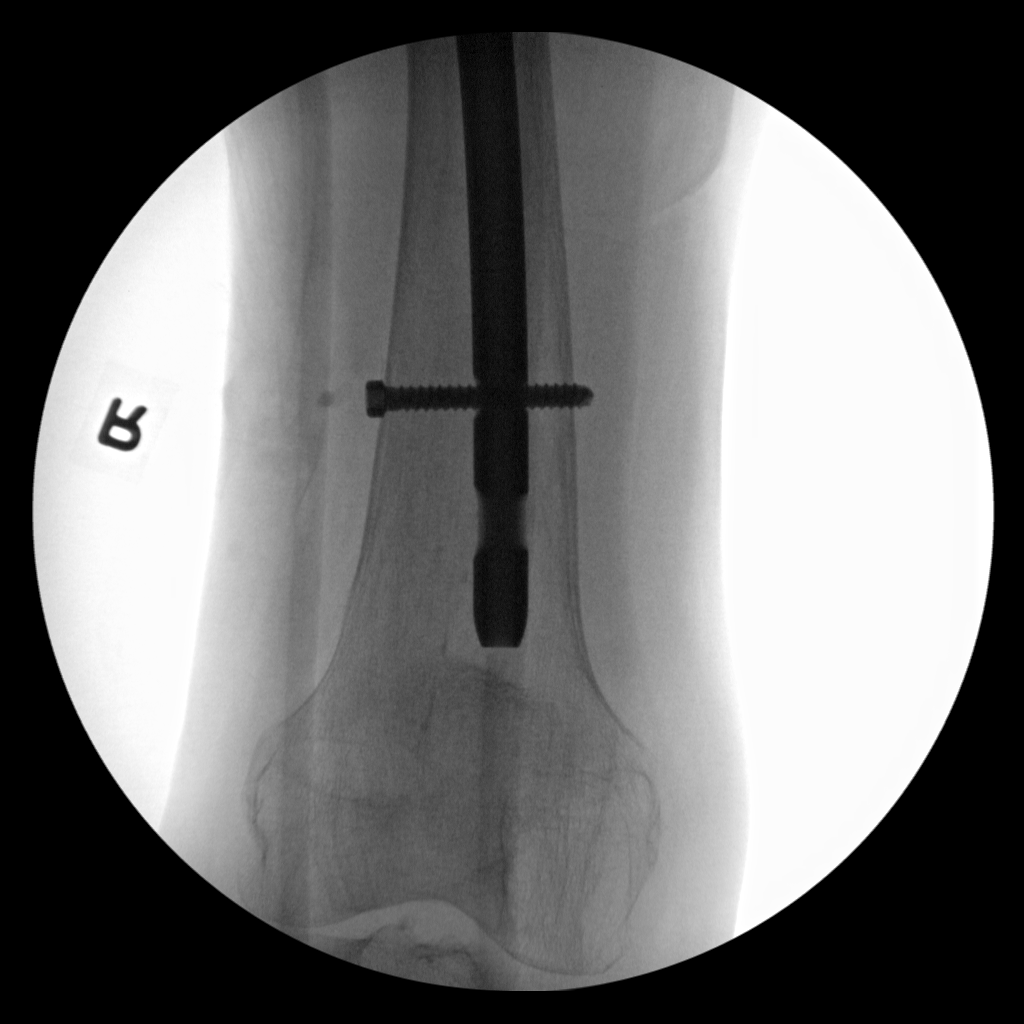
[im 4/4]
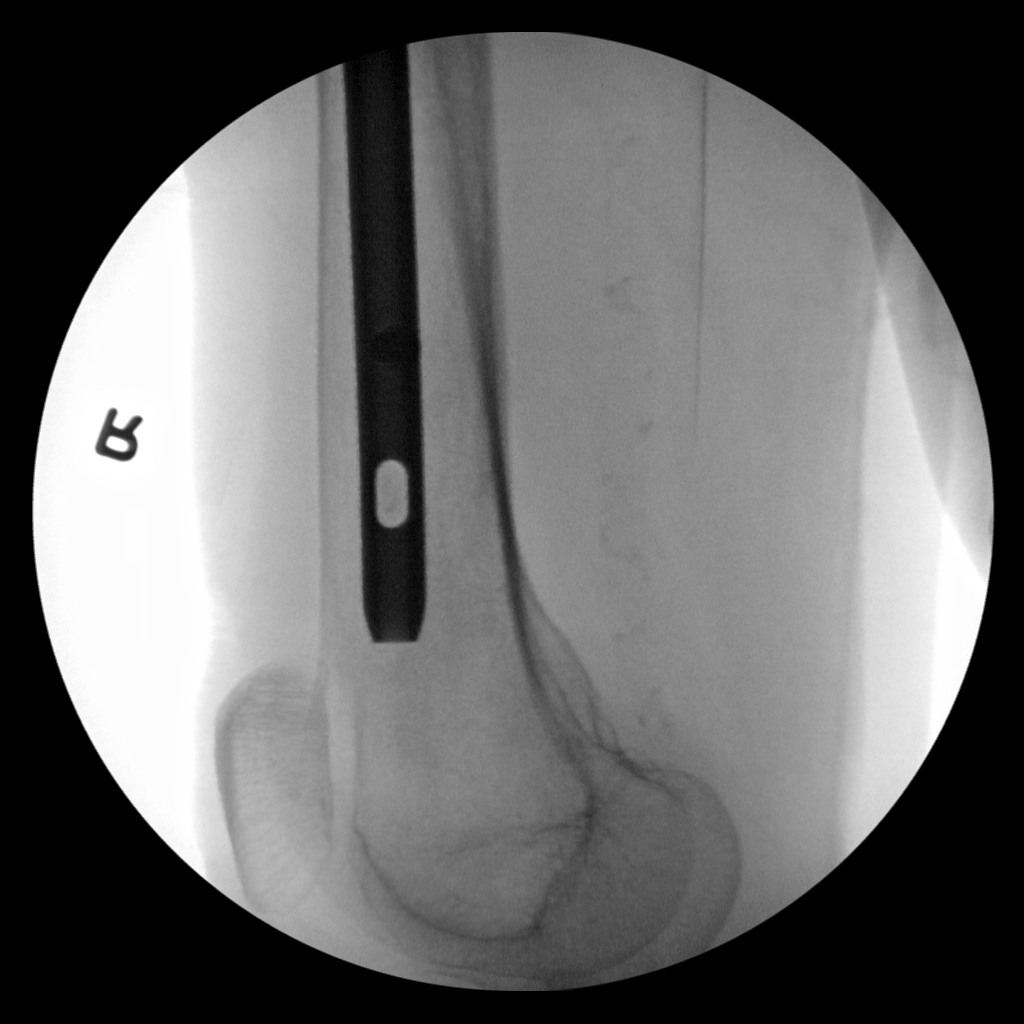

[4 of 4 positions shown; findings below may reference images not displayed]

FINDINGS: 4 spot intraoperative fluoroscopic images of the right femur are
provided for review.

Images demonstrate the sequela of intramedullary rod fixation of the
right femur and dynamic screw fixation of the right femoral neck.
The distal end of the intramedullary femoral rod is transfixed with
a single cancellous screw.

Alignment appears near anatomic. Residual deformity about the right
lesser trochanter. No definite new fracture.

Adjacent vascular calcifications. Expected adjacent subcutaneous
emphysema. No radiopaque foreign body.
IMPRESSION: Post intramedullary rod fixation of comminuted intertrochanteric
femur fracture without evidence of complication.

## 2022-08-13 DEATH — deceased
